# Patient Record
Sex: Male | Born: 1955 | Race: Asian | Hispanic: No | Marital: Married | Smoking: Current every day smoker
Health system: Southern US, Community
[De-identification: ages and names within clinical notes are randomized; demographics above are authoritative.]

## PROBLEM LIST (undated history)

## (undated) ENCOUNTER — Emergency Department (HOSPITAL_BASED_OUTPATIENT_CLINIC_OR_DEPARTMENT_OTHER): Payer: BC Managed Care – PPO

## (undated) DIAGNOSIS — E785 Hyperlipidemia, unspecified: Secondary | ICD-10-CM

## (undated) DIAGNOSIS — R7303 Prediabetes: Secondary | ICD-10-CM

## (undated) HISTORY — DX: Prediabetes: R73.03

## (undated) HISTORY — DX: Hyperlipidemia, unspecified: E78.5

---

## 2016-05-27 ENCOUNTER — Other Ambulatory Visit: Payer: Self-pay | Admitting: Family Medicine

## 2016-05-27 DIAGNOSIS — R1013 Epigastric pain: Secondary | ICD-10-CM

## 2016-06-04 ENCOUNTER — Ambulatory Visit
Admission: RE | Admit: 2016-06-04 | Discharge: 2016-06-04 | Disposition: A | Discharge status: Home / Self Care | Payer: Commercial Managed Care - HMO | Source: Ambulatory Visit | Attending: Family Medicine | Admitting: Family Medicine

## 2016-06-04 DIAGNOSIS — R1013 Epigastric pain: Secondary | ICD-10-CM

## 2016-06-14 ENCOUNTER — Other Ambulatory Visit: Payer: Self-pay | Admitting: Physician Assistant

## 2016-06-14 DIAGNOSIS — K769 Liver disease, unspecified: Secondary | ICD-10-CM

## 2016-06-14 DIAGNOSIS — R748 Abnormal levels of other serum enzymes: Secondary | ICD-10-CM

## 2016-06-14 DIAGNOSIS — K625 Hemorrhage of anus and rectum: Secondary | ICD-10-CM

## 2016-06-28 ENCOUNTER — Ambulatory Visit
Admission: RE | Admit: 2016-06-28 | Discharge: 2016-06-28 | Disposition: A | Discharge status: Home / Self Care | Payer: Commercial Managed Care - HMO | Source: Ambulatory Visit | Attending: Physician Assistant | Admitting: Physician Assistant

## 2016-06-28 DIAGNOSIS — K625 Hemorrhage of anus and rectum: Secondary | ICD-10-CM

## 2016-06-28 DIAGNOSIS — K769 Liver disease, unspecified: Secondary | ICD-10-CM

## 2016-06-28 DIAGNOSIS — R748 Abnormal levels of other serum enzymes: Secondary | ICD-10-CM

## 2016-06-28 MED ORDER — IOPAMIDOL (ISOVUE-300) INJECTION 61%
100.0000 mL | Freq: Once | INTRAVENOUS | Status: AC | PRN
  Administered 2016-06-28: 100 mL via INTRAVENOUS

## 2018-02-24 ENCOUNTER — Ambulatory Visit (INDEPENDENT_AMBULATORY_CARE_PROVIDER_SITE_OTHER): Payer: BLUE CROSS/BLUE SHIELD | Admitting: Family Medicine

## 2018-02-24 ENCOUNTER — Encounter: Payer: Self-pay | Admitting: Family Medicine

## 2018-02-24 VITALS — BP 110/78 | HR 80 | Temp 97.8°F | Ht 68.0 in | Wt 159.4 lb

## 2018-02-24 DIAGNOSIS — N529 Male erectile dysfunction, unspecified: Secondary | ICD-10-CM | POA: Diagnosis not present

## 2018-02-24 DIAGNOSIS — Z720 Tobacco use: Secondary | ICD-10-CM | POA: Diagnosis not present

## 2018-02-24 DIAGNOSIS — Z0000 Encounter for general adult medical examination without abnormal findings: Secondary | ICD-10-CM

## 2018-02-24 DIAGNOSIS — Z125 Encounter for screening for malignant neoplasm of prostate: Secondary | ICD-10-CM | POA: Diagnosis not present

## 2018-02-24 LAB — COMPREHENSIVE METABOLIC PANEL
ALT: 27 U/L (ref 0–53)
AST: 17 U/L (ref 0–37)
Albumin: 4.4 g/dL (ref 3.5–5.2)
Alkaline Phosphatase: 44 U/L (ref 39–117)
BILIRUBIN TOTAL: 0.7 mg/dL (ref 0.2–1.2)
BUN: 16 mg/dL (ref 6–23)
CALCIUM: 9.6 mg/dL (ref 8.4–10.5)
CO2: 30 mEq/L (ref 19–32)
CREATININE: 0.78 mg/dL (ref 0.40–1.50)
Chloride: 103 mEq/L (ref 96–112)
GFR: 101.21 mL/min (ref >60.00)
Glucose, Bld: 103 mg/dL — ABNORMAL HIGH (ref 70–99)
Potassium: 4.1 mEq/L (ref 3.5–5.1)
Sodium: 140 mEq/L (ref 135–145)
Total Protein: 6.9 g/dL (ref 6.0–8.3)

## 2018-02-24 LAB — LIPID PANEL
Cholesterol: 202 mg/dL — ABNORMAL HIGH (ref 0–200)
HDL: 43.2 mg/dL (ref >39.00)
LDL CALC: 132 mg/dL — AB (ref 0–99)
NonHDL: 158.57
TRIGLYCERIDES: 134 mg/dL (ref 0.0–149.0)
Total CHOL/HDL Ratio: 5
VLDL: 26.8 mg/dL (ref 0.0–40.0)

## 2018-02-24 LAB — PSA: PSA: 0.99 ng/mL (ref 0.10–4.00)

## 2018-02-24 MED ORDER — SILDENAFIL CITRATE 100 MG PO TABS: 50.0000 mg | ORAL_TABLET | Freq: Every day | ORAL | 0 refills | Status: DC | PRN

## 2018-02-24 NOTE — Progress Notes (Signed)
Chief Complaint  Patient presents with  . New Patient (Initial Visit)    Well Male Justin Edwards is here for a complete physical.   His last physical was >1 year ago.  Current diet: in general, a "healthy" diet.  Current exercise: running Weight trend: stable Does pt snore? Yes. No apneic episodes or gasping for air. Daytime fatigue? No. Seat belt? Yes.    Health maintenance Shingrix- No Colonoscopy- Yes Tetanus- unsure HIV- Yes 2017 Hep C- Yes 2017   Past Medical History:  Diagnosis Date  . Hyperlipidemia      History reviewed. No pertinent surgical history.  Medications  Takes no meds routinely.  Allergies No Known Allergies  Family History Family History  Problem Relation Age of Onset  . Anxiety disorder Father     Review of Systems: Constitutional:  no fevers Eye:  no recent significant change in vision Ear/Nose/Mouth/Throat:  Ears:  no hearing loss Nose/Mouth/Throat:  no complaints of nasal congestion, no sore throat Cardiovascular:  no chest pain, no palpitations Respiratory:  no cough and no shortness of breath Gastrointestinal: +intermittent reflux; no abdominal pain, no change in bowel habits GU:  Male: negative for dysuria, frequency, and incontinence and negative for prostate symptoms; +ED Musculoskeletal/Extremities:  no pain, redness, or swelling of the joints Integumentary (Skin/Breast):  no abnormal skin lesions reported Neurologic:  no headaches Endocrine: No unexpected weight changes Hematologic/Lymphatic:  no abnormal bleeding  Exam BP 110/78 (BP Location: Left Arm, Patient Position: Sitting, Cuff Size: Normal)   Pulse 80   Temp 97.8 F (36.6 C) (Oral)   Ht 5\' 8"  (1.727 m)   Wt 159 lb 6 oz (72.3 kg)   SpO2 95%   BMI 24.23 kg/m  General:  well developed, well nourished, in no apparent distress Skin:  no significant moles, warts, or growths Head:  no masses, lesions, or tenderness Eyes:  pupils equal and round, sclera anicteric  without injection Ears:  canals without lesions, TMs shiny without retraction, no obvious effusion, no erythema Nose:  nares patent, septum midline, mucosa normal Throat/Pharynx:  lips and gingiva without lesion; tongue and uvula midline; non-inflamed pharynx; no exudates or postnasal drainage Neck: neck supple without adenopathy, thyromegaly, or masses Lungs:  clear to auscultation, breath sounds equal bilaterally, no respiratory distress Cardio:  regular rate and rhythm, no LE edema, no bruits Abdomen:  abdomen soft, nontender; bowel sounds normal; no masses or organomegaly Genital (male): Uncircumcised penis, no lesions or discharge; testes present bilaterally without masses or tenderness Rectal: Deferred Musculoskeletal:  symmetrical muscle groups noted without atrophy or deformity Extremities:  no clubbing, cyanosis, or edema, no deformities, no skin discoloration Neuro:  gait normal; deep tendon reflexes normal and symmetric Psych: well oriented with normal range of affect and appropriate judgment/insight  Assessment and Plan  Well adult exam - Plan: Lipid panel, Comprehensive metabolic panel  Screening for prostate cancer - Plan: PSA  Tobacco abuse  Erectile dysfunction, unspecified erectile dysfunction type   Well 63 y.o. male. Counseled on diet and exercise. Counseled on risks and benefits of prostate cancer screening with PSA. The patient agrees to undergo testing. Immunizations, labs, and further orders as above. Info on Shingrix given. Declines flu shot. Had a shot around 3 yrs ago but does not remember what it was. He will let us know as he will consider PCV23 and Tdap.  Stop smoking. Trial Viagra. Follow up in 1 yr pending above. The patient voiced understanding and agreement to the plan.  [employer]  Carren Rangaul [place], DO 02/24/18 7:37 AM

## 2018-02-24 NOTE — Patient Instructions (Addendum)
Give [place] 2-3 business days to get the results of your labs back.   Keep the diet clean and stay active. Add some weight resistance training to your workout regimen.   The new Shingrix vaccine (for shingles) is a 2 shot series. It can make people feel low energy, achy and almost like they have the flu for 48 hours after injection. Please plan accordingly when deciding on when to get this shot. Call our office for a nurse visit appointment to get this. The second shot of the series is less severe regarding the side effects, but it still lasts 48 hours.   Stop smoking.    Find out what shot you got 3 years ago and let us know.   Let us know if you need anything.

## 2018-02-24 NOTE — Progress Notes (Signed)
Pre visit review using our clinic review tool, if applicable. No additional management support is needed unless otherwise documented below in the visit note. 

## 2018-09-01 ENCOUNTER — Other Ambulatory Visit: Payer: Self-pay

## 2018-09-01 ENCOUNTER — Encounter: Payer: Self-pay | Admitting: Family Medicine

## 2018-09-01 ENCOUNTER — Ambulatory Visit (INDEPENDENT_AMBULATORY_CARE_PROVIDER_SITE_OTHER): Payer: BC Managed Care – PPO | Admitting: Family Medicine

## 2018-09-01 VITALS — BP 108/68 | HR 95 | Temp 98.1°F | Ht 68.0 in | Wt 164.2 lb

## 2018-09-01 DIAGNOSIS — R350 Frequency of micturition: Secondary | ICD-10-CM | POA: Diagnosis not present

## 2018-09-01 NOTE — Patient Instructions (Signed)
Give [place] 2-3 business days to get the results of your labs back.   Mind caffeine intake.  Let us know if you need anything.

## 2018-09-01 NOTE — Progress Notes (Signed)
Chief Complaint  Patient presents with  . Follow-up    Subjective: Patient is a 63 y.o. male here for urinary freq.  3 mo, goes every 2 hours while awake, no nighttime s/s's. No change in diet or oral intake. Sometimes will get very hungry and start sweating if he doesn't eat. Curious to see if he has diabetes. Rarely drinks caffeine. No alcohol use. He is gassy. He is not having any pain, bleeding, constipation, fevers, rashes, weight changes.    ROS: Const: no fevers GU: As noted in HPI  Past Medical History:  Diagnosis Date  . Hyperlipidemia     Objective: BP 108/68 (BP Location: Left Arm, Patient Position: Sitting, Cuff Size: Normal)   Pulse 95   Temp 98.1 F (36.7 C) (Oral)   Ht 5\' 8"  (1.727 m)   Wt 164 lb 4 oz (74.5 kg)   SpO2 97%   BMI 24.97 kg/m  General: Awake, appears stated age HEENT: MMM Abd: BS+, S, mildly distended, NT, no masses Heart: RRR, no murmurs Lungs: CTAB, no rales, wheezes or rhonchi. No accessory muscle use Psych: Age appropriate judgment and insight, normal affect and mood  Assessment and Plan: Frequent urination - Plan: Hemoglobin A1c, Urinalysis, Routine w reflex microscopic, UA neg, if a1c WNL, will rec simethicone and bowel reg for 1 week, would start alpha 1 blocker if that is of no help. PSA in Jan of this year WNL.   The patient voiced understanding and agreement to the plan.  [place], DO 09/01/18  4:40 PM

## 2018-09-02 ENCOUNTER — Encounter: Payer: Self-pay | Admitting: Family Medicine

## 2018-09-02 DIAGNOSIS — R7303 Prediabetes: Secondary | ICD-10-CM | POA: Insufficient documentation

## 2018-09-02 LAB — URINALYSIS, ROUTINE W REFLEX MICROSCOPIC
Bilirubin Urine: NEGATIVE
Glucose, UA: NEGATIVE
Hgb urine dipstick: NEGATIVE
Ketones, ur: NEGATIVE
Leukocytes,Ua: NEGATIVE
Nitrite: NEGATIVE
Protein, ur: NEGATIVE
Specific Gravity, Urine: 1.023 (ref 1.001–1.03)
pH: <=5 (ref 5.0–8.0)

## 2018-09-02 LAB — HEMOGLOBIN A1C
Hgb A1c MFr Bld: 6.1 % of total Hgb — ABNORMAL HIGH (ref <5.7)
Mean Plasma Glucose: 128 (calc)
eAG (mmol/L): 7.1 (calc)

## 2018-09-05 ENCOUNTER — Telehealth: Payer: Self-pay | Admitting: Family Medicine

## 2018-09-05 NOTE — Telephone Encounter (Signed)
f °

## 2018-09-05 NOTE — Telephone Encounter (Signed)
Copied from [place] [id number]. Topic: General - Other >> Sep 05, 2018 11:18 AM Rayann Heman wrote: Reason for CRM: pt wife called and stated that pt did not understand everything when they called about labs. Pt wife would like a call so that she can understand. Please advise

## 2018-09-05 NOTE — Telephone Encounter (Signed)
Called back to the patient and went over lab results/instructions (09/01/2018 results) again. He did verbalize understanding and had no other questions at this time.

## 2018-12-15 ENCOUNTER — Encounter: Payer: Self-pay | Admitting: Family Medicine

## 2018-12-15 ENCOUNTER — Ambulatory Visit (INDEPENDENT_AMBULATORY_CARE_PROVIDER_SITE_OTHER): Payer: BC Managed Care – PPO | Admitting: Family Medicine

## 2018-12-15 ENCOUNTER — Other Ambulatory Visit: Payer: Self-pay

## 2018-12-15 DIAGNOSIS — R059 Cough, unspecified: Secondary | ICD-10-CM

## 2018-12-15 DIAGNOSIS — R05 Cough: Secondary | ICD-10-CM

## 2018-12-15 MED ORDER — PREDNISONE 20 MG PO TABS: 40.0000 mg | ORAL_TABLET | Freq: Every day | ORAL | 0 refills | Status: AC

## 2018-12-15 NOTE — Progress Notes (Signed)
Chief Complaint  Patient presents with  . Cough    2 weeks    Alvino Chapel here for URI complaints. Due to COVID-19 pandemic, we are interacting via web portal for an electronic face-to-face visit. I verified patient's ID using 2 identifiers. Patient agreed to proceed with visit via this method. Patient is at home, I am at office. Patient and I are present for visit.   Duration: 2 weeks  Associated symptoms: sinus congestion, rhinorrhea, itchy watery eyes, shortness of breath and cough Denies: sinus pain, ear pain, ear drainage, sore throat, wheezing, myalgia and fevers, n/v/d Treatment to date: none Sick contacts: No  +hx of smoking Requesting CXR.  ROS:  Const: Denies fevers HEENT: As noted in HPI Lungs: +cough  Past Medical History:  Diagnosis Date  . Hyperlipidemia   . Prediabetes    Exam No conversational dyspnea Age appropriate judgment and insight Nml affect and mood  Cough - Plan: predniSONE (DELTASONE) 20 MG tablet  Pred burst. CXR at [facility] on COVID testing.  Continue to push fluids, practice good hand hygiene, cover mouth when coughing. F/u prn. If starting to experience fevers, shaking, or shortness of breath, seek immediate care. Pt voiced understanding and agreement to the plan.  [place], DO 12/15/18 3:14 PM

## 2018-12-29 ENCOUNTER — Ambulatory Visit (INDEPENDENT_AMBULATORY_CARE_PROVIDER_SITE_OTHER)
Admission: RE | Admit: 2018-12-29 | Discharge: 2018-12-29 | Disposition: A | Discharge status: Home / Self Care | Payer: BC Managed Care – PPO | Source: Ambulatory Visit | Attending: Family Medicine | Admitting: Family Medicine

## 2018-12-29 ENCOUNTER — Other Ambulatory Visit: Payer: Self-pay | Admitting: Family Medicine

## 2018-12-29 ENCOUNTER — Other Ambulatory Visit: Payer: Self-pay

## 2018-12-29 DIAGNOSIS — R05 Cough: Secondary | ICD-10-CM | POA: Diagnosis not present

## 2018-12-29 DIAGNOSIS — R059 Cough, unspecified: Secondary | ICD-10-CM

## 2019-08-31 ENCOUNTER — Ambulatory Visit (INDEPENDENT_AMBULATORY_CARE_PROVIDER_SITE_OTHER): Payer: BC Managed Care – PPO | Admitting: Family Medicine

## 2019-08-31 ENCOUNTER — Encounter: Payer: Self-pay | Admitting: Family Medicine

## 2019-08-31 ENCOUNTER — Other Ambulatory Visit: Payer: Self-pay

## 2019-08-31 VITALS — BP 122/80 | HR 75 | Temp 98.3°F | Ht 67.0 in | Wt 163.4 lb

## 2019-08-31 DIAGNOSIS — N529 Male erectile dysfunction, unspecified: Secondary | ICD-10-CM

## 2019-08-31 DIAGNOSIS — Z0000 Encounter for general adult medical examination without abnormal findings: Secondary | ICD-10-CM | POA: Diagnosis not present

## 2019-08-31 DIAGNOSIS — K2100 Gastro-esophageal reflux disease with esophagitis, without bleeding: Secondary | ICD-10-CM | POA: Diagnosis not present

## 2019-08-31 DIAGNOSIS — Z125 Encounter for screening for malignant neoplasm of prostate: Secondary | ICD-10-CM

## 2019-08-31 MED ORDER — SILDENAFIL CITRATE 100 MG PO TABS: 50.0000 mg | ORAL_TABLET | Freq: Every day | ORAL | 0 refills | Status: DC | PRN

## 2019-08-31 NOTE — Progress Notes (Signed)
Chief Complaint  Patient presents with  . Annual Exam    Well Male Justin Edwards is here for a complete physical.   His last physical was >1 year ago.  Current diet: in general, a pretty healthy diet.  Current exercise: cardio, wt resistance exercise Weight trend: stable Fatigue out of ordinary? No. Seat belt? Yes.    Health maintenance Shingrix- No Colonoscopy- Yes Tetanus- No HIV- Yes Hep C- Yes  Past Medical History:  Diagnosis Date  . Hyperlipidemia   . Prediabetes      History reviewed. No pertinent surgical history.  Medications  Current Outpatient Medications on File Prior to Visit  Medication Sig Dispense Refill  . sildenafil (VIAGRA) 100 MG tablet Take 0.5-1 tablets (50-100 mg total) by mouth daily as needed for erectile dysfunction. 10 tablet 0   Allergies No Known Allergies  Family History Family History  Problem Relation Age of Onset  . Anxiety disorder Father     Review of Systems: Constitutional:  no fevers Eye:  no recent significant change in vision Ear/Nose/Mouth/Throat:  Ears:  no hearing loss Nose/Mouth/Throat:  no complaints of nasal congestion, no sore throat Cardiovascular:  no chest pain Respiratory:  no shortness of breath Gastrointestinal:  no change in bowel habits GU:  Male: negative for dysuria, frequency Musculoskeletal/Extremities:  no joint pain Integumentary (Skin/Breast):  no abnormal skin lesions reported Neurologic:  no headaches Endocrine: No unexpected weight changes Hematologic/Lymphatic:  no abnormal bleeding  Exam BP 122/80 (BP Location: Left Arm, Patient Position: Sitting, Cuff Size: Normal)   Pulse 75   Temp 98.3 F (36.8 C) (Oral)   Ht 5\' 7"  (1.702 m)   Wt 163 lb 6 oz (74.1 kg)   SpO2 98%   BMI 25.59 kg/m  General:  well developed, well nourished, in no apparent distress Skin:  no significant moles, warts, or growths Head:  no masses, lesions, or tenderness Eyes:  pupils equal and round, sclera  anicteric without injection Ears:  canals without lesions, TMs shiny without retraction, no obvious effusion, no erythema Nose:  nares patent, septum midline, mucosa normal Throat/Pharynx:  lips and gingiva without lesion; tongue and uvula midline; non-inflamed pharynx; no exudates or postnasal drainage Neck: neck supple without adenopathy, thyromegaly, or masses Cardiac: RRR, no bruits, no LE edema Lungs:  clear to auscultation, breath sounds equal bilaterally, no respiratory distress Rectal: Deferred Musculoskeletal:  symmetrical muscle groups noted without atrophy or deformity Neuro:  gait normal; deep tendon reflexes normal and symmetric Psych: well oriented with normal range of affect and appropriate judgment/insight  Assessment and Plan  Well adult exam - Plan: CBC, Comprehensive metabolic panel, Lipid panel  Screening for prostate cancer - Plan: PSA  Gastroesophageal reflux disease with esophagitis, unspecified whether hemorrhage - Plan: Ambulatory referral to Gastroenterology   Well 64 y.o. male. Counseled on diet and exercise. Counseled on risks and benefits of prostate cancer screening with PSA. The patient agrees to undergo testing. 1-2 mo of reflux helped by Tums. Wife requesting he have an EGD. Will refer to GI for that conversation. Pt is open to whatever is appropriate.  Immunizations, labs, and further orders as above. Follow up in 1 yr. The patient voiced understanding and agreement to the plan.  [address] [place], DO 08/31/19 2:52 PM

## 2019-08-31 NOTE — Patient Instructions (Addendum)
Give [place] 2-3 business days to get the results of your labs back.   Keep the diet clean and stay active.  If you do not hear anything about your referral in the next 1-2 weeks, call our office and ask for an update.  I recommend getting the flu shot in mid October. This suggestion would change if the CDC comes out with a different recommendation.   I think the most important shot left for you to get is the pneumonia vaccine. After that the Tdap (tetanus) booster and lastly the shingles vaccine.   The new Shingrix vaccine (for shingles) is a 2 shot series. It can make people feel low energy, achy and almost like they have the flu for 48 hours after injection. Please plan accordingly when deciding on when to get this shot. Call our office for a nurse visit appointment to get this. The second shot of the series is less severe regarding the side effects, but it still lasts 48 hours.   Call your insurance company to see what erectile dysfunction medications they wil cover. Looking for manufacturer coupons online is also reasonable. Look into GoodRx also.   Let us know if you need anything.

## 2019-08-31 NOTE — Addendum Note (Signed)
Addended by: Harley Alto on: 08/31/2019 03:06 PM   Modules accepted: Orders

## 2019-09-01 LAB — LIPID PANEL
Cholesterol: 217 mg/dL — ABNORMAL HIGH (ref <200)
HDL: 35 mg/dL — ABNORMAL LOW (ref >=40)
LDL Cholesterol (Calc): 137 mg/dL (calc) — ABNORMAL HIGH
Non-HDL Cholesterol (Calc): 182 mg/dL (calc) — ABNORMAL HIGH (ref <130)
Total CHOL/HDL Ratio: 6.2 (calc) — ABNORMAL HIGH (ref <5.0)
Triglycerides: 312 mg/dL — ABNORMAL HIGH (ref <150)

## 2019-09-01 LAB — COMPREHENSIVE METABOLIC PANEL
AG Ratio: 1.8 (calc) (ref 1.0–2.5)
ALT: 19 U/L (ref 9–46)
AST: 16 U/L (ref 10–35)
Albumin: 4.2 g/dL (ref 3.6–5.1)
Alkaline phosphatase (APISO): 41 U/L (ref 35–144)
BUN: 18 mg/dL (ref 7–25)
CO2: 24 mmol/L (ref 20–32)
Calcium: 9.3 mg/dL (ref 8.6–10.3)
Chloride: 105 mmol/L (ref 98–110)
Creat: 0.96 mg/dL (ref 0.70–1.25)
Globulin: 2.4 g/dL (calc) (ref 1.9–3.7)
Glucose, Bld: 133 mg/dL — ABNORMAL HIGH (ref 65–99)
Potassium: 3.8 mmol/L (ref 3.5–5.3)
Sodium: 140 mmol/L (ref 135–146)
Total Bilirubin: 0.4 mg/dL (ref 0.2–1.2)
Total Protein: 6.6 g/dL (ref 6.1–8.1)

## 2019-09-01 LAB — CBC
HCT: 45 % (ref 38.5–50.0)
Hemoglobin: 14.8 g/dL (ref 13.2–17.1)
MCH: 31.4 pg (ref 27.0–33.0)
MCHC: 32.9 g/dL (ref 32.0–36.0)
MCV: 95.3 fL (ref 80.0–100.0)
MPV: 9.6 fL (ref 7.5–12.5)
Platelets: 344 10*3/uL (ref 140–400)
RBC: 4.72 10*6/uL (ref 4.20–5.80)
RDW: 12.3 % (ref 11.0–15.0)
WBC: 7.9 10*3/uL (ref 3.8–10.8)

## 2019-09-01 LAB — PSA: PSA: 0.7 ng/mL (ref <=4.0)

## 2019-09-03 ENCOUNTER — Other Ambulatory Visit: Payer: Self-pay | Admitting: Family Medicine

## 2019-09-03 DIAGNOSIS — R7303 Prediabetes: Secondary | ICD-10-CM

## 2019-10-19 ENCOUNTER — Other Ambulatory Visit: Payer: BC Managed Care – PPO

## 2019-10-25 NOTE — Addendum Note (Signed)
Addended by: Mervin Kung A on: 10/25/2019 08:50 AM   Modules accepted: Orders

## 2019-10-26 ENCOUNTER — Other Ambulatory Visit: Payer: Self-pay

## 2019-10-26 ENCOUNTER — Other Ambulatory Visit (INDEPENDENT_AMBULATORY_CARE_PROVIDER_SITE_OTHER): Payer: BC Managed Care – PPO

## 2019-10-26 DIAGNOSIS — R7303 Prediabetes: Secondary | ICD-10-CM

## 2019-10-27 LAB — HEMOGLOBIN A1C
Hgb A1c MFr Bld: 6.2 % of total Hgb — ABNORMAL HIGH (ref <5.7)
Mean Plasma Glucose: 131 (calc)
eAG (mmol/L): 7.3 (calc)

## 2019-10-27 LAB — LIPID PANEL
Cholesterol: 208 mg/dL — ABNORMAL HIGH (ref <200)
HDL: 42 mg/dL (ref >=40)
LDL Cholesterol (Calc): 138 mg/dL (calc) — ABNORMAL HIGH
Non-HDL Cholesterol (Calc): 166 mg/dL (calc) — ABNORMAL HIGH (ref <130)
Total CHOL/HDL Ratio: 5 (calc) — ABNORMAL HIGH (ref <5.0)
Triglycerides: 152 mg/dL — ABNORMAL HIGH (ref <150)

## 2019-10-29 ENCOUNTER — Other Ambulatory Visit: Payer: Self-pay | Admitting: Family Medicine

## 2019-10-29 DIAGNOSIS — R7303 Prediabetes: Secondary | ICD-10-CM

## 2019-10-29 MED ORDER — ROSUVASTATIN CALCIUM 10 MG PO TABS: 10.0000 mg | ORAL_TABLET | Freq: Every day | ORAL | 3 refills | Status: DC

## 2019-11-01 ENCOUNTER — Telehealth: Payer: Self-pay | Admitting: Family Medicine

## 2019-11-01 NOTE — Telephone Encounter (Signed)
Patient states medication is very expensive. Patient states he would like to try to bring his numbers down by healthy eating and exercising.   Please advise

## 2019-11-02 MED ORDER — PRAVASTATIN SODIUM 40 MG PO TABS: 40.0000 mg | ORAL_TABLET | Freq: Every day | ORAL | 3 refills | Status: DC

## 2019-11-02 NOTE — Telephone Encounter (Signed)
Called left message to call back 

## 2019-11-02 NOTE — Telephone Encounter (Signed)
That's fine, but I will send in another one that should be cheaper if he wants to try once more. Ty.

## 2019-11-02 NOTE — Telephone Encounter (Signed)
Called informed of PCP response. He prefers to try to eat better/increase exercise at this time.

## 2019-11-06 ENCOUNTER — Telehealth: Payer: Self-pay | Admitting: Family Medicine

## 2019-11-06 NOTE — Telephone Encounter (Signed)
Caller: Justin Edwards Call Back # [phone]  Pt called in regards to medication questions he has for the scripts called into [facility] and the dosage instructions.  He wants to know if the medication could 'be eliminated by diet and exercise'. Please advise, pt is requesting a return call at earliest convenience    Thanks

## 2019-11-06 NOTE — Telephone Encounter (Signed)
Patient informed of PCP response. He will try diet first.

## 2019-11-06 NOTE — Telephone Encounter (Signed)
Possible though unlikely for him as he isn't a largely unhealthy individual. I had already sent med before seeing we wanted to try lifestyle mods. Ty.

## 2019-11-22 ENCOUNTER — Encounter: Payer: Self-pay | Admitting: Family Medicine

## 2019-11-30 ENCOUNTER — Encounter: Payer: Self-pay | Admitting: Family Medicine

## 2019-11-30 ENCOUNTER — Telehealth (INDEPENDENT_AMBULATORY_CARE_PROVIDER_SITE_OTHER): Payer: BC Managed Care – PPO | Admitting: Family Medicine

## 2019-11-30 ENCOUNTER — Other Ambulatory Visit: Payer: Self-pay

## 2019-11-30 DIAGNOSIS — R059 Cough, unspecified: Secondary | ICD-10-CM | POA: Diagnosis not present

## 2019-11-30 MED ORDER — ALBUTEROL SULFATE HFA 108 (90 BASE) MCG/ACT IN AERS: 2.0000 | INHALATION_SPRAY | Freq: Four times a day (QID) | RESPIRATORY_TRACT | 0 refills | Status: DC | PRN

## 2019-11-30 NOTE — Progress Notes (Signed)
Chief Complaint  Patient presents with  . Cough    due to smoking    Justin Edwards here for a cough. Due to COVID-19 pandemic, we are interacting via web portal for an electronic face-to-face visit. I verified patient's ID using 2 identifiers. Patient agreed to proceed with visit via this method. Patient is at home, I am at office. Patient and I are present for visit.   Duration: 10 days  Associated symptoms: some DOE Denies: sinus congestion, sinus pain, rhinorrhea, itchy watery eyes, ear pain, ear drainage, sore throat, wheezing, chest pain, myalgia and fevers, GERD Treatment to date: none Sick contacts: No  He just cut down on smoking after cough arose.  No weight loss or bleeding.   Past Medical History:  Diagnosis Date  . Hyperlipidemia   . Prediabetes    Exam No conversational dyspnea Age appropriate judgment and insight Nml affect and mood  Cough - Plan: DG Chest 2 View, albuterol (VENTOLIN HFA) 108 (90 Base) MCG/ACT inhaler  Does not sound infectious. 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 Trial SABA. Ck Xr. Follow up pending results or prn. If starting to experience fevers, shaking, or shortness of breath, seek immediate care. Pt voiced understanding and agreement to the plan.  Jilda Roche [place], DO 11/30/19 11:51 AM

## 2019-12-07 ENCOUNTER — Other Ambulatory Visit: Payer: Self-pay

## 2019-12-07 ENCOUNTER — Ambulatory Visit (INDEPENDENT_AMBULATORY_CARE_PROVIDER_SITE_OTHER)
Admission: RE | Admit: 2019-12-07 | Discharge: 2019-12-07 | Disposition: A | Discharge status: Home / Self Care | Payer: BC Managed Care – PPO | Source: Ambulatory Visit | Attending: Family Medicine | Admitting: Family Medicine

## 2019-12-07 DIAGNOSIS — R059 Cough, unspecified: Secondary | ICD-10-CM | POA: Diagnosis not present

## 2019-12-14 ENCOUNTER — Other Ambulatory Visit: Payer: BC Managed Care – PPO

## 2019-12-24 ENCOUNTER — Other Ambulatory Visit: Payer: Self-pay | Admitting: Family Medicine

## 2019-12-24 DIAGNOSIS — R059 Cough, unspecified: Secondary | ICD-10-CM

## 2019-12-28 ENCOUNTER — Other Ambulatory Visit: Payer: Self-pay

## 2019-12-28 ENCOUNTER — Other Ambulatory Visit: Payer: BC Managed Care – PPO

## 2020-01-04 ENCOUNTER — Other Ambulatory Visit: Payer: Self-pay | Admitting: Family Medicine

## 2020-01-04 ENCOUNTER — Other Ambulatory Visit (INDEPENDENT_AMBULATORY_CARE_PROVIDER_SITE_OTHER): Payer: BC Managed Care – PPO

## 2020-01-04 ENCOUNTER — Other Ambulatory Visit: Payer: Self-pay

## 2020-01-04 ENCOUNTER — Telehealth: Payer: Self-pay | Admitting: Family Medicine

## 2020-01-04 DIAGNOSIS — E785 Hyperlipidemia, unspecified: Secondary | ICD-10-CM

## 2020-01-04 DIAGNOSIS — R7303 Prediabetes: Secondary | ICD-10-CM

## 2020-01-04 LAB — LIPID PANEL
Cholesterol: 231 mg/dL — ABNORMAL HIGH (ref 0–200)
HDL: 45.9 mg/dL (ref >39.00)
LDL Cholesterol: 157 mg/dL — ABNORMAL HIGH (ref 0–99)
NonHDL: 185.19
Total CHOL/HDL Ratio: 5
Triglycerides: 139 mg/dL (ref 0.0–149.0)
VLDL: 27.8 mg/dL (ref 0.0–40.0)

## 2020-01-04 LAB — HEPATIC FUNCTION PANEL
ALT: 25 U/L (ref 0–53)
AST: 22 U/L (ref 0–37)
Albumin: 4.4 g/dL (ref 3.5–5.2)
Alkaline Phosphatase: 45 U/L (ref 39–117)
Bilirubin, Direct: 0.1 mg/dL (ref 0.0–0.3)
Total Bilirubin: 0.9 mg/dL (ref 0.2–1.2)
Total Protein: 7.1 g/dL (ref 6.0–8.3)

## 2020-01-04 NOTE — Telephone Encounter (Signed)
Pt came in office stating is wanting to change his pharmacy instead of [facility], pt would like to have his pharmacy to be [facility]. Please update for pt on chart.

## 2020-01-04 NOTE — Telephone Encounter (Signed)
Pharmacy updated.

## 2020-01-29 DIAGNOSIS — U071 COVID-19: Secondary | ICD-10-CM | POA: Diagnosis not present

## 2020-01-29 DIAGNOSIS — R059 Cough, unspecified: Secondary | ICD-10-CM | POA: Diagnosis not present

## 2020-02-15 ENCOUNTER — Other Ambulatory Visit: Payer: BC Managed Care – PPO

## 2020-02-22 ENCOUNTER — Other Ambulatory Visit (INDEPENDENT_AMBULATORY_CARE_PROVIDER_SITE_OTHER): Payer: BC Managed Care – PPO

## 2020-02-22 ENCOUNTER — Other Ambulatory Visit: Payer: Self-pay

## 2020-02-22 DIAGNOSIS — E785 Hyperlipidemia, unspecified: Secondary | ICD-10-CM

## 2020-02-22 LAB — LIPID PANEL
Cholesterol: 197 mg/dL (ref 0–200)
HDL: 48.8 mg/dL (ref >39.00)
LDL Cholesterol: 116 mg/dL — ABNORMAL HIGH (ref 0–99)
NonHDL: 147.87
Total CHOL/HDL Ratio: 4
Triglycerides: 161 mg/dL — ABNORMAL HIGH (ref 0.0–149.0)
VLDL: 32.2 mg/dL (ref 0.0–40.0)

## 2020-02-26 ENCOUNTER — Telehealth: Payer: Self-pay | Admitting: Family Medicine

## 2020-02-26 NOTE — Telephone Encounter (Signed)
Patient is requesting a call back in reference to lab results.   

## 2020-02-26 NOTE — Telephone Encounter (Signed)
Called informed of lab results.  . Cholesterol looks better. I think we can monitor the level for now with no changes to medicine.

## 2020-07-21 IMAGING — DX DG CHEST 2V
2 series · 2 of 2 positions shown · non-contrast
Comparison: None.

CLINICAL DATA: 63-year-old male with cough.

EXAM:
CHEST - 2 VIEW

[chest pa]
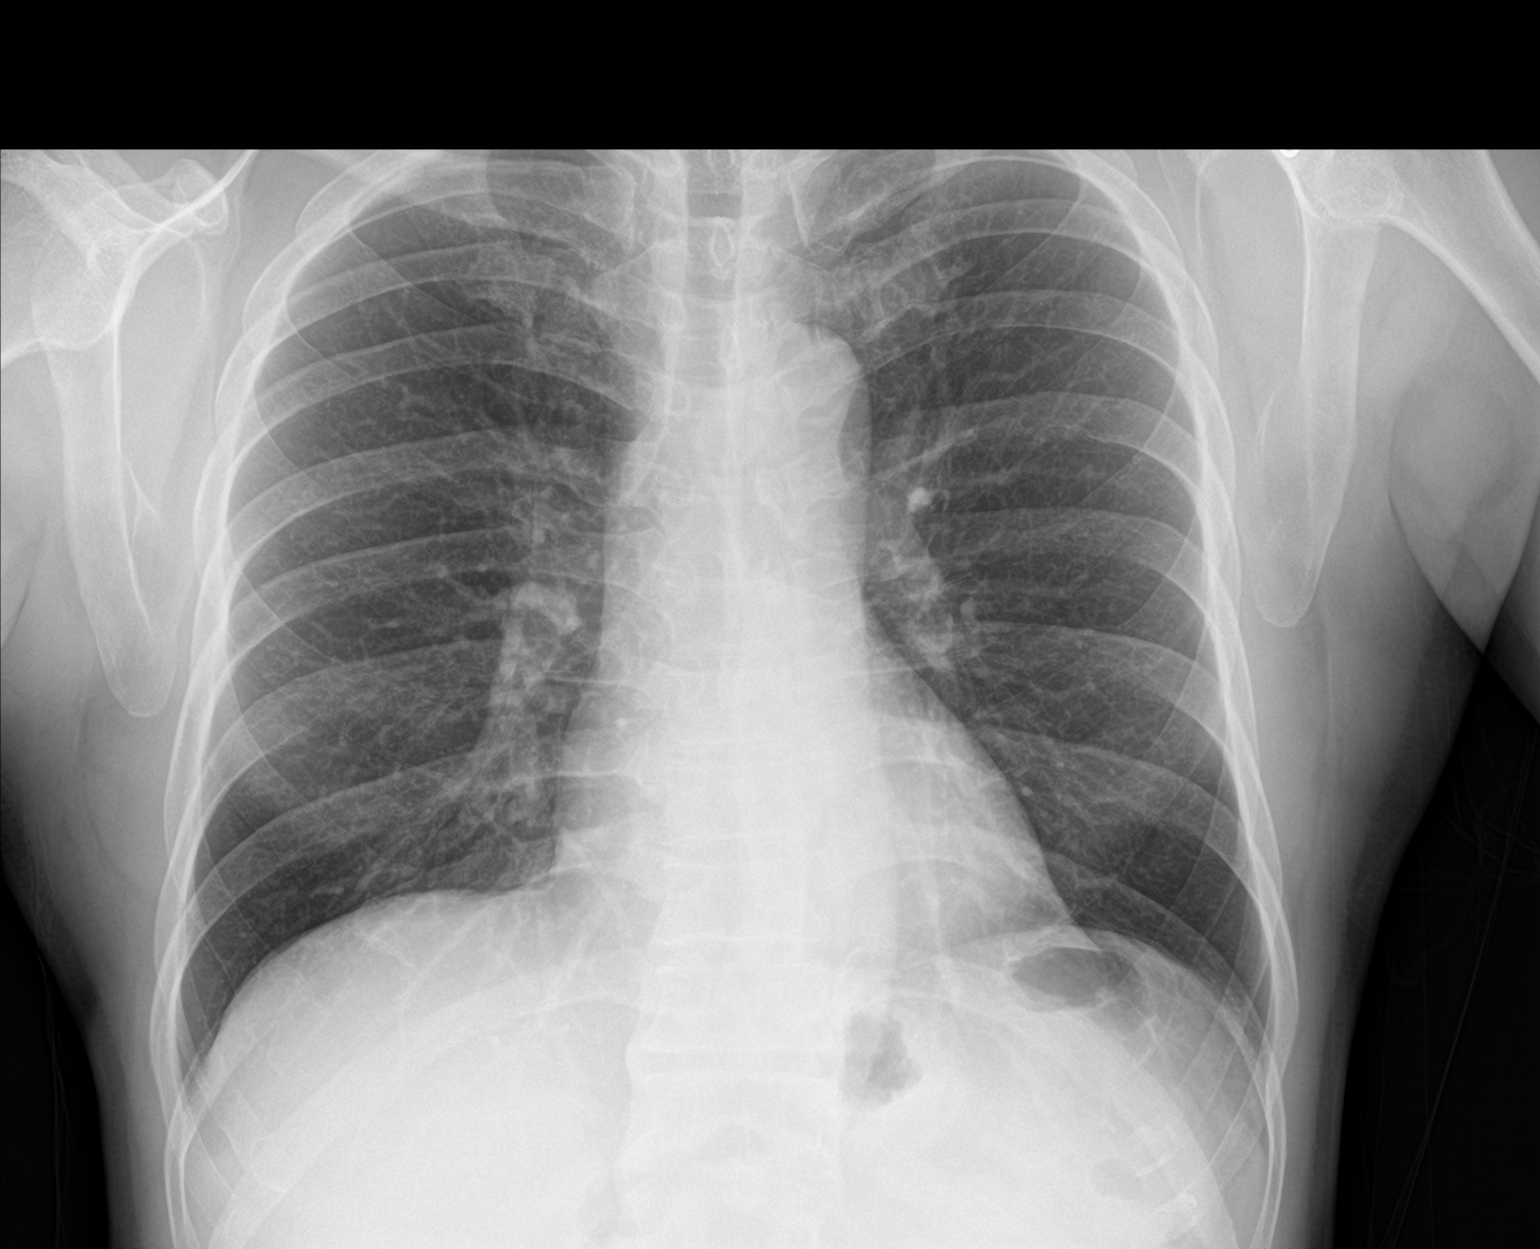

[chest lat]
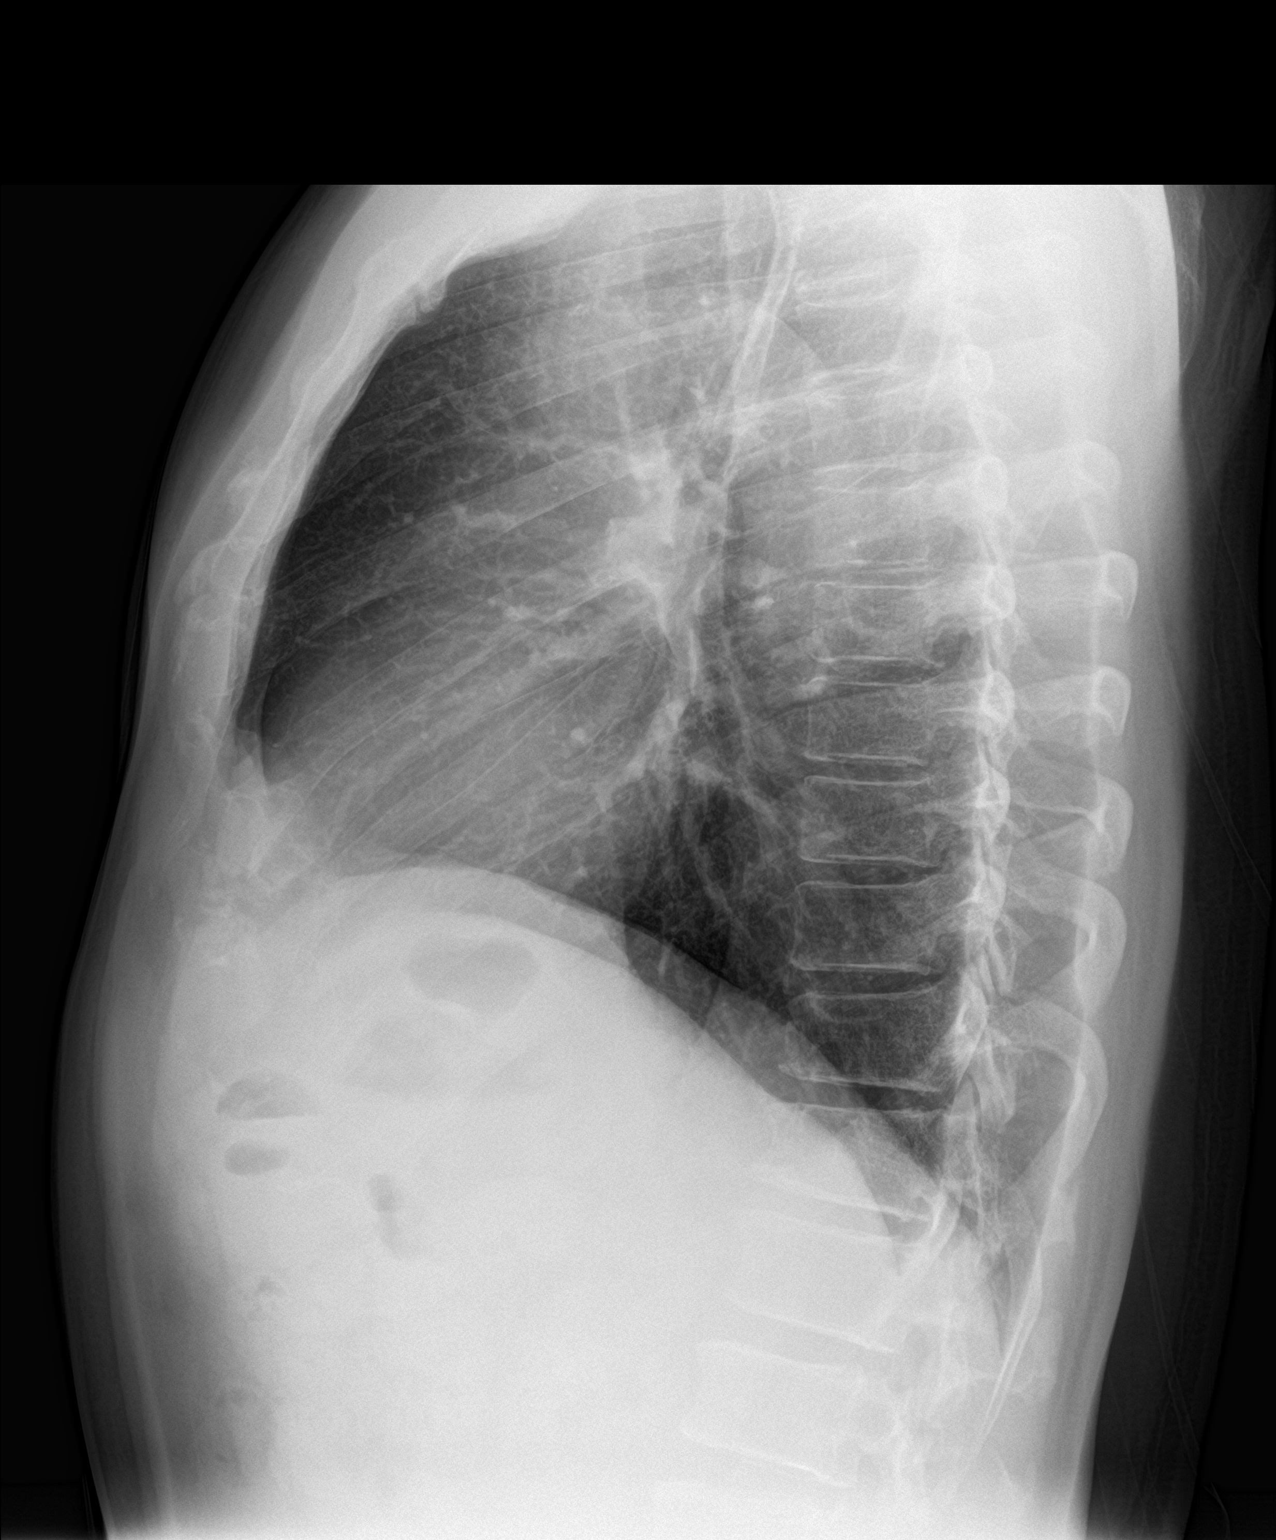

[2 of 2 positions shown; findings below may reference images not displayed]

FINDINGS: The heart size and mediastinal contours are within normal limits.
Both lungs are clear. The visualized skeletal structures are
unremarkable.
IMPRESSION: No active cardiopulmonary disease.

## 2021-04-03 DIAGNOSIS — K449 Diaphragmatic hernia without obstruction or gangrene: Secondary | ICD-10-CM | POA: Diagnosis not present

## 2021-04-03 DIAGNOSIS — D123 Benign neoplasm of transverse colon: Secondary | ICD-10-CM | POA: Diagnosis not present

## 2021-04-03 DIAGNOSIS — Z86010 Personal history of colonic polyps: Secondary | ICD-10-CM | POA: Diagnosis not present

## 2021-04-03 DIAGNOSIS — K2980 Duodenitis without bleeding: Secondary | ICD-10-CM | POA: Diagnosis not present

## 2021-04-03 DIAGNOSIS — B9681 Helicobacter pylori [H. pylori] as the cause of diseases classified elsewhere: Secondary | ICD-10-CM | POA: Diagnosis not present

## 2021-04-03 DIAGNOSIS — R1013 Epigastric pain: Secondary | ICD-10-CM | POA: Diagnosis not present

## 2021-04-03 DIAGNOSIS — K2100 Gastro-esophageal reflux disease with esophagitis, without bleeding: Secondary | ICD-10-CM | POA: Diagnosis not present

## 2021-04-03 DIAGNOSIS — K2930 Chronic superficial gastritis without bleeding: Secondary | ICD-10-CM | POA: Diagnosis not present

## 2021-04-17 ENCOUNTER — Other Ambulatory Visit (HOSPITAL_BASED_OUTPATIENT_CLINIC_OR_DEPARTMENT_OTHER): Payer: Self-pay

## 2021-04-17 MED ORDER — PYLERA 140-125-125 MG PO CAPS
3.0000 | ORAL_CAPSULE | Freq: Three times a day (TID) | ORAL | 0 refills | Status: DC
  Filled 2021-04-17: qty 120, 10d supply, fill #0

## 2021-04-20 ENCOUNTER — Other Ambulatory Visit (HOSPITAL_BASED_OUTPATIENT_CLINIC_OR_DEPARTMENT_OTHER): Payer: Self-pay

## 2021-04-21 ENCOUNTER — Other Ambulatory Visit (HOSPITAL_BASED_OUTPATIENT_CLINIC_OR_DEPARTMENT_OTHER): Payer: Self-pay

## 2021-04-22 ENCOUNTER — Other Ambulatory Visit (HOSPITAL_BASED_OUTPATIENT_CLINIC_OR_DEPARTMENT_OTHER): Payer: Self-pay

## 2021-04-23 ENCOUNTER — Other Ambulatory Visit (HOSPITAL_BASED_OUTPATIENT_CLINIC_OR_DEPARTMENT_OTHER): Payer: Self-pay

## 2021-05-11 ENCOUNTER — Encounter: Payer: BC Managed Care – PPO | Admitting: Family Medicine

## 2021-05-15 ENCOUNTER — Encounter: Payer: Self-pay | Admitting: Family Medicine

## 2021-05-15 ENCOUNTER — Ambulatory Visit (INDEPENDENT_AMBULATORY_CARE_PROVIDER_SITE_OTHER): Payer: BC Managed Care – PPO | Admitting: Family Medicine

## 2021-05-15 VITALS — BP 112/70 | HR 92 | Temp 98.0°F | Ht 67.0 in | Wt 162.4 lb

## 2021-05-15 DIAGNOSIS — Z125 Encounter for screening for malignant neoplasm of prostate: Secondary | ICD-10-CM

## 2021-05-15 DIAGNOSIS — N529 Male erectile dysfunction, unspecified: Secondary | ICD-10-CM

## 2021-05-15 DIAGNOSIS — Z0000 Encounter for general adult medical examination without abnormal findings: Secondary | ICD-10-CM | POA: Diagnosis not present

## 2021-05-15 MED ORDER — SILDENAFIL CITRATE 100 MG PO TABS: 50.0000 mg | ORAL_TABLET | Freq: Every day | ORAL | 0 refills | Status: DC | PRN

## 2021-05-15 NOTE — Patient Instructions (Addendum)
Give [place] 2-3 business days to get the results of your labs back.  ? ?Keep the diet clean and stay active. ? ?Please get me a copy of your advanced directive form at your convenience.  ? ?Use GoodRx for the sildenafil/Viagra. $0.49 at [facility]. ? ?Let us know if you need anything. ?

## 2021-05-15 NOTE — Progress Notes (Signed)
Chief Complaint  ?Patient presents with  ? Annual Exam  ? ? ?Well Male ?Justin Edwards is here for a complete physical.   ?His last physical was >1 year ago.  ?Current diet: in general, a "healthy" diet.  ?Current exercise: cycling, lifting wts ?Weight trend: stable ?Fatigue out of ordinary? No. ?Seat belt? Yes.   ?Advanced directive? No ? ?Health maintenance ?Shingrix- No ?Colonoscopy- Yes ?Tetanus- Yes ?HIV- Yes ?Hep C- Yes ?  ?Past Medical History:  ?Diagnosis Date  ? Hyperlipidemia   ? Prediabetes   ?  ? ?History reviewed. No pertinent surgical history. ? ?Medications  ?Current Outpatient Medications on File Prior to Visit  ?Medication Sig Dispense Refill  ? albuterol (VENTOLIN HFA) 108 (90 Base) MCG/ACT inhaler TAKE 2 PUFFS BY MOUTH EVERY 6 HOURS AS NEEDED FOR WHEEZE OR SHORTNESS OF BREATH 6.7 each 3  ? bismuth-metronidazole-tetracycline (PYLERA) 140-125-125 MG capsule Take 3 capsules by mouth 4 (four) times daily - after meals and at bedtime. 120 capsule 0  ? ?No current facility-administered medications on file prior to visit.  ?  ? ?Allergies ?No Known Allergies ? ?Family History ?Family History  ?Problem Relation Age of Onset  ? Anxiety disorder Father   ? ? ?Review of Systems: ?Constitutional:  no fevers ?Eye:  no recent significant change in vision ?Ear/Nose/Mouth/Throat:  Ears:  no hearing loss ?Nose/Mouth/Throat:  no complaints of nasal congestion, no sore throat ?Cardiovascular:  no chest pain ?Respiratory:  no shortness of breath ?Gastrointestinal:  no change in bowel habits ?GU:  Male: negative for dysuria, frequency ?Musculoskeletal/Extremities:  no joint pain ?Integumentary (Skin/Breast):  no abnormal skin lesions reported ?Neurologic:  no headaches ?Endocrine: No unexpected weight changes ?Hematologic/Lymphatic:  no abnormal bleeding ? ?Exam ?BP 112/70   Pulse 92   Temp 98 ?F (36.7 ?C) (Oral)   Ht 5\' 7"  (1.702 m)   Wt 162 lb 6 oz (73.7 kg)   SpO2 97%   BMI 25.43 kg/m?  ?General:  well  developed, well nourished, in no apparent distress ?Skin:  no significant moles, warts, or growths ?Head:  no masses, lesions, or tenderness ?Eyes:  pupils equal and round, sclera anicteric without injection ?Ears:  canals without lesions, TMs shiny without retraction, no obvious effusion, no erythema ?Nose:  nares patent, septum midline, mucosa normal ?Throat/Pharynx:  lips and gingiva without lesion; tongue and uvula midline; non-inflamed pharynx; no exudates or postnasal drainage ?Neck: neck supple without adenopathy, thyromegaly, or masses ?Cardiac: RRR, no bruits, no LE edema ?Lungs:  clear to auscultation, breath sounds equal bilaterally, no respiratory distress ?Abdomen: BS+, soft, non-tender, non-distended, no masses or organomegaly noted ?Rectal: Deferred ?Musculoskeletal:  symmetrical muscle groups noted without atrophy or deformity ?Neuro:  gait normal; deep tendon reflexes normal and symmetric ?Psych: well oriented with normal range of affect and appropriate judgment/insight ? ?Assessment and Plan ? ?Well adult exam - Plan: CBC, Comprehensive metabolic panel, Lipid panel, Hemoglobin A1c ? ?Screening for prostate cancer - Plan: PSA ? ?Erectile dysfunction, unspecified erectile dysfunction type - Plan: sildenafil (VIAGRA) 100 MG tablet  ? ?Well 66 y.o. male. ?Counseled on diet and exercise. ?Counseled on risks and benefits of prostate cancer screening with PSA. The patient agrees to undergo testing. ?Advanced directive form provided today.  ?GoodRx for sildenafil.  ?Immunizations, labs, and further orders as above. ?Follow up in 6 mo. ?The patient voiced understanding and agreement to the plan. ? ?Shelda Pal, DO ?05/15/21 ?2:43 PM ? ?

## 2021-05-22 ENCOUNTER — Other Ambulatory Visit (INDEPENDENT_AMBULATORY_CARE_PROVIDER_SITE_OTHER): Payer: BC Managed Care – PPO

## 2021-05-22 DIAGNOSIS — Z0000 Encounter for general adult medical examination without abnormal findings: Secondary | ICD-10-CM | POA: Diagnosis not present

## 2021-05-22 DIAGNOSIS — Z125 Encounter for screening for malignant neoplasm of prostate: Secondary | ICD-10-CM

## 2021-05-22 LAB — COMPREHENSIVE METABOLIC PANEL
ALT: 55 U/L — ABNORMAL HIGH (ref 0–53)
AST: 33 U/L (ref 0–37)
Albumin: 4.5 g/dL (ref 3.5–5.2)
Alkaline Phosphatase: 44 U/L (ref 39–117)
BUN: 15 mg/dL (ref 6–23)
CO2: 31 mEq/L (ref 19–32)
Calcium: 9.1 mg/dL (ref 8.4–10.5)
Chloride: 102 mEq/L (ref 96–112)
Creatinine, Ser: 0.83 mg/dL (ref 0.40–1.50)
GFR: 92.7 mL/min (ref >60.00)
Glucose, Bld: 89 mg/dL (ref 70–99)
Potassium: 4.3 mEq/L (ref 3.5–5.1)
Sodium: 140 mEq/L (ref 135–145)
Total Bilirubin: 0.8 mg/dL (ref 0.2–1.2)
Total Protein: 7.1 g/dL (ref 6.0–8.3)

## 2021-05-22 LAB — LIPID PANEL
Cholesterol: 256 mg/dL — ABNORMAL HIGH (ref 0–200)
HDL: 50.1 mg/dL (ref >39.00)
LDL Cholesterol: 178 mg/dL — ABNORMAL HIGH (ref 0–99)
NonHDL: 206.17
Total CHOL/HDL Ratio: 5
Triglycerides: 142 mg/dL (ref 0.0–149.0)
VLDL: 28.4 mg/dL (ref 0.0–40.0)

## 2021-05-22 LAB — HEMOGLOBIN A1C: Hgb A1c MFr Bld: 6.3 % (ref 4.6–6.5)

## 2021-05-22 LAB — CBC
HCT: 48.5 % (ref 39.0–52.0)
Hemoglobin: 16.3 g/dL (ref 13.0–17.0)
MCHC: 33.6 g/dL (ref 30.0–36.0)
MCV: 96 fl (ref 78.0–100.0)
Platelets: 349 10*3/uL (ref 150.0–400.0)
RBC: 5.05 Mil/uL (ref 4.22–5.81)
RDW: 13.1 % (ref 11.5–15.5)
WBC: 6.5 10*3/uL (ref 4.0–10.5)

## 2021-05-22 LAB — PSA: PSA: 0.78 ng/mL (ref 0.10–4.00)

## 2021-05-29 ENCOUNTER — Ambulatory Visit (INDEPENDENT_AMBULATORY_CARE_PROVIDER_SITE_OTHER): Payer: BC Managed Care – PPO | Admitting: Family Medicine

## 2021-05-29 ENCOUNTER — Encounter: Payer: Self-pay | Admitting: Family Medicine

## 2021-05-29 VITALS — BP 128/78 | HR 92 | Temp 98.2°F | Ht 67.0 in | Wt 162.5 lb

## 2021-05-29 DIAGNOSIS — R7401 Elevation of levels of liver transaminase levels: Secondary | ICD-10-CM

## 2021-05-29 DIAGNOSIS — Z87891 Personal history of nicotine dependence: Secondary | ICD-10-CM | POA: Diagnosis not present

## 2021-05-29 DIAGNOSIS — E785 Hyperlipidemia, unspecified: Secondary | ICD-10-CM | POA: Diagnosis not present

## 2021-05-29 NOTE — Patient Instructions (Addendum)
If you do not hear anything about your referral in the next 1-2 weeks, call our office and ask for an update. ? ?Fast for at least 6 hrs prior to your lab visit. Nothing fried or greasy within 24 hours of testing.  ? ?Let us know if you need anything. ?

## 2021-05-29 NOTE — Progress Notes (Signed)
Chief Complaint  ?Patient presents with  ? Follow-up  ? ? ?Subjective: ?Patient is a 66 y.o. male here for f/u. ? ?Just quit smoking 4 d ago. Feels good about his progress so far. Coughs at night every once in a while. No wheezing or sig sob. Reports smoking around 1 ppd for nearly 40 years which is different from previous hx. He has never had a CT chest performed for screening.  ? ?Past Medical History:  ?Diagnosis Date  ? Hyperlipidemia   ? Prediabetes   ? ? ?Objective: ?BP 128/78   Pulse 92   Temp 98.2 ?F (36.8 ?C) (Oral)   Ht 5\' 7"  (1.702 m)   Wt 162 lb 8 oz (73.7 kg)   SpO2 98%   BMI 25.45 kg/m?  ?General: Awake, appears stated age ?Heart: RRR ?Lungs: CTAB, no rales, wheezes or rhonchi. No accessory muscle use ?Psych: Age appropriate judgment and insight, normal affect and mood ? ?Assessment and Plan: ?Stopped smoking with greater than 20 pack year history - Plan: Ambulatory Referral Lung Cancer Screening [facility] ? ?Hyperlipidemia, unspecified hyperlipidemia type - Plan: Lipid panel, Hepatic function panel ? ?Elevated ALT measurement - Plan: Hepatic function panel ? ?Offered meds for possible COPD which he politely declined as it is not affecting his quality of life and not causing him to come in to doctor's office. Will order a low dose CT scan of chest as he has a 20 pack year hx, not a 15 pack year as previous documentation indicates. Commended him for smoking cessation.  ?Discussed rechecking labs at convenience. If we see atherosclerosis on imaging, may be a better indicator to go back on statin.  ?Rec'd Tdap which he politely declined as he is on Triple therapy for H pylori. Will offer again at f/u. Suggested he consider getting at pharmacy as well.  ?F/u as originally scheduled.  ?The patient voiced understanding and agreement to the plan. ? ?I spent 22 min w the patient discussing the above plan in addition to reviewing his chart on the same day of the visit.  ? ? ,  DO ?05/29/21  ?7:31 AM ? ? ? ? ?

## 2021-06-02 ENCOUNTER — Other Ambulatory Visit: Payer: BC Managed Care – PPO

## 2021-06-04 ENCOUNTER — Other Ambulatory Visit (INDEPENDENT_AMBULATORY_CARE_PROVIDER_SITE_OTHER): Payer: BC Managed Care – PPO

## 2021-06-04 DIAGNOSIS — R7401 Elevation of levels of liver transaminase levels: Secondary | ICD-10-CM | POA: Diagnosis not present

## 2021-06-04 DIAGNOSIS — E785 Hyperlipidemia, unspecified: Secondary | ICD-10-CM

## 2021-06-04 LAB — LIPID PANEL
Cholesterol: 231 mg/dL — ABNORMAL HIGH (ref 0–200)
HDL: 47.4 mg/dL (ref >39.00)
NonHDL: 183.7
Total CHOL/HDL Ratio: 5
Triglycerides: 201 mg/dL — ABNORMAL HIGH (ref 0.0–149.0)
VLDL: 40.2 mg/dL — ABNORMAL HIGH (ref 0.0–40.0)

## 2021-06-04 LAB — HEPATIC FUNCTION PANEL
ALT: 33 U/L (ref 0–53)
AST: 24 U/L (ref 0–37)
Albumin: 4.5 g/dL (ref 3.5–5.2)
Alkaline Phosphatase: 46 U/L (ref 39–117)
Bilirubin, Direct: 0.1 mg/dL (ref 0.0–0.3)
Total Bilirubin: 0.7 mg/dL (ref 0.2–1.2)
Total Protein: 7.2 g/dL (ref 6.0–8.3)

## 2021-06-04 LAB — LDL CHOLESTEROL, DIRECT: Direct LDL: 162 mg/dL

## 2021-06-08 ENCOUNTER — Other Ambulatory Visit (HOSPITAL_BASED_OUTPATIENT_CLINIC_OR_DEPARTMENT_OTHER): Payer: Self-pay

## 2021-06-08 ENCOUNTER — Other Ambulatory Visit: Payer: Self-pay | Admitting: Family Medicine

## 2021-06-08 ENCOUNTER — Telehealth: Payer: Self-pay | Admitting: Family Medicine

## 2021-06-08 DIAGNOSIS — E785 Hyperlipidemia, unspecified: Secondary | ICD-10-CM

## 2021-06-08 DIAGNOSIS — R7401 Elevation of levels of liver transaminase levels: Secondary | ICD-10-CM

## 2021-06-08 MED ORDER — ROSUVASTATIN CALCIUM 10 MG PO TABS
10.0000 mg | ORAL_TABLET | Freq: Every day | ORAL | 3 refills | Status: DC
Start: 2021-06-08 — End: 2021-07-17
  Filled 2021-06-08: qty 30, 30d supply, fill #0

## 2021-06-08 NOTE — Telephone Encounter (Signed)
Patient called and agrees to start statin--- please send in. ?What labs to order in 6 weeks??? ?

## 2021-06-08 NOTE — Telephone Encounter (Signed)
Called and scheduled lab appointment. ?Please send in prescription to the [facility] ?

## 2021-06-15 ENCOUNTER — Telehealth: Payer: Self-pay | Admitting: Family Medicine

## 2021-06-15 NOTE — Telephone Encounter (Signed)
Nurse Assessment Nurse: Susy Manor, RN, Megan Date/Time (Eastern Time): 06/15/2021 10:43:18 AM Confirm and document reason for call. If symptomatic, describe symptoms. ---Caller states Dr. Jone Baseman gave him medicine for cholesterol (Rosuvastatin) and he is experiencing dry cough, difficulty breathing. Caller stated the medication 3 days ago. Does the patient have any new or worsening symptoms? ---Yes Will a triage be completed? ---Yes Related visit to physician within the last 2 weeks? ---Yes Does the PT have any chronic conditions? (i.e. diabetes, asthma, this includes High risk factors for pregnancy, etc.) ---Yes List chronic conditions. ---high cholesterol, Is this a behavioral health or substance abuse call? ---No Guidelines Guideline Title Affirmed Question Affirmed Notes Nurse Date/Time (Eastern Time) Breathing Difficulty [1] MODERATE difficulty breathing (e.g., speaks in phrases, SOB even at rest, pulse 100-120) AND [2] NEW-onset Susy Manor, RN, Jinny Blossom 06/15/2021 10:44:58 AM PLEASE NOTE: All timestamps contained within this report are represented as [place] Standard Time. CONFIDENTIALTY NOTICE: This fax transmission is intended only for the addressee. It contains information that is legally privileged, confidential or otherwise protected from use or disclosure. If you are not the intended recipient, you are strictly prohibited from reviewing, disclosing, copying using or disseminating any of this information or taking any action in reliance on or regarding this information. If you have received this fax in error, please notify us immediately by telephone so that we can arrange for its return to [place]. Phone: [phone], Toll-Free: [phone], Fax: [phone] Page: 2 of 2 Call Id: [id number] Guidelines Guideline Title Affirmed Question Affirmed Notes Nurse Date/Time Eilene Ghazi Time) or WORSE than normal Disp. Time Eilene Ghazi Time) Disposition Final User 06/15/2021 10:40:27 AM Send  to Urgent Sylvan Cheese 06/15/2021 10:49:05 AM Go to ED Now Yes Susy Manor, RN, Megan Caller Disagree/Comply Comply Caller Understands Yes PreDisposition InappropriateToAsk Care Advice Given Per Guideline GO TO ED NOW: * You need to be seen in the Emergency Department. * Go to the ED at ___________ [place] given per Breathing Difficulty (Adult) guideline. Comments User: Sula Soda, RN Date/Time Eilene Ghazi Time): 06/15/2021 10:49:59 AM caller states he going to speak with his wife first since she is a Therapist, sports Referrals [place]

## 2021-06-15 NOTE — Telephone Encounter (Signed)
Pt states he thinks crestor is making him SOB. Transferred to triage. Pt also stated he was supposed to get a ct scan, did not see orders in his chart but did give number for pulm referral.

## 2021-06-15 NOTE — Telephone Encounter (Signed)
Patient informed of PCP instructions. He verbalized understanding. 

## 2021-06-16 ENCOUNTER — Telehealth: Payer: Self-pay

## 2021-06-16 NOTE — Telephone Encounter (Signed)
Called the patient gave him the correct number to [facility] [phone]. Address [address] [place].

## 2021-06-16 NOTE — Telephone Encounter (Signed)
Pt called to check on status of lung cancer screening referral from 05/29/21- he was given  Pulmonary's number yesterday- he called them and was informed that they do not schedule for them. I informed Pt that we would f/u on this for him.

## 2021-07-03 ENCOUNTER — Other Ambulatory Visit: Payer: BC Managed Care – PPO

## 2021-07-16 ENCOUNTER — Other Ambulatory Visit: Payer: Self-pay | Admitting: *Deleted

## 2021-07-16 DIAGNOSIS — Z87891 Personal history of nicotine dependence: Secondary | ICD-10-CM

## 2021-07-16 DIAGNOSIS — Z122 Encounter for screening for malignant neoplasm of respiratory organs: Secondary | ICD-10-CM

## 2021-07-16 DIAGNOSIS — F17210 Nicotine dependence, cigarettes, uncomplicated: Secondary | ICD-10-CM

## 2021-07-17 ENCOUNTER — Ambulatory Visit (INDEPENDENT_AMBULATORY_CARE_PROVIDER_SITE_OTHER): Payer: BC Managed Care – PPO | Admitting: Internal Medicine

## 2021-07-17 ENCOUNTER — Encounter: Payer: Self-pay | Admitting: Internal Medicine

## 2021-07-17 VITALS — BP 128/80 | HR 86 | Temp 97.8°F | Resp 16 | Ht 67.0 in | Wt 160.2 lb

## 2021-07-17 DIAGNOSIS — R030 Elevated blood-pressure reading, without diagnosis of hypertension: Secondary | ICD-10-CM

## 2021-07-17 DIAGNOSIS — N529 Male erectile dysfunction, unspecified: Secondary | ICD-10-CM | POA: Insufficient documentation

## 2021-07-17 DIAGNOSIS — K625 Hemorrhage of anus and rectum: Secondary | ICD-10-CM | POA: Insufficient documentation

## 2021-07-17 DIAGNOSIS — Z860100 Personal history of colon polyps, unspecified: Secondary | ICD-10-CM | POA: Insufficient documentation

## 2021-07-17 DIAGNOSIS — K769 Liver disease, unspecified: Secondary | ICD-10-CM | POA: Insufficient documentation

## 2021-07-17 DIAGNOSIS — K219 Gastro-esophageal reflux disease without esophagitis: Secondary | ICD-10-CM | POA: Insufficient documentation

## 2021-07-20 ENCOUNTER — Telehealth: Payer: Self-pay | Admitting: Family Medicine

## 2021-07-20 NOTE — Telephone Encounter (Signed)
Pt called stating he was experiencing the following symptoms over the past week:  - Elevated blood pressure reading consistently around 140/90 (Pt did not have exact readings)  - Ringing in the L. Ear  -Sleepy if home  Pt was transferred to triage nurse for further eval.

## 2021-07-20 NOTE — Telephone Encounter (Signed)
Nurse Assessment Nurse: Thana Farr, RN, Ladona Ridgel Date/Time Justin Edwards Time): 07/20/2021 11:05:19 AM Confirm and document reason for call. If symptomatic, describe symptoms. ---He also has ringing in his ears. his blood pressure is 140/90. Symptoms 1.5 weeks ago. Not on BP medication. Denies SOB, chest pain, fever, headache or any other symptoms at this time. Does the patient have any new or worsening symptoms? ---Yes Will a triage be completed? ---Yes Related visit to physician within the last 2 weeks? ---No Does the PT have any chronic conditions? (i.e. diabetes, asthma, this includes High risk factors for pregnancy, etc.) ---Yes List chronic conditions. ---Seasonal Allergies Is this a behavioral health or substance abuse call? ---No Guidelines Guideline Title Affirmed Question Affirmed Notes Nurse Date/Time (Eastern Time) Blood Pressure - High [1] Systolic BP >= 130 OR Diastolic >= 80 AND [2] not taking BP medications [facility] 07/20/2021 11:06:30 AM PLEASE NOTE: All timestamps contained within this report are represented as [place] Standard Time. CONFIDENTIALTY NOTICE: This fax transmission is intended only for the addressee. It contains information that is legally privileged, confidential or otherwise protected from use or disclosure. If you are not the intended recipient, you are strictly prohibited from reviewing, disclosing, copying using or disseminating any of this information or taking any action in reliance on or regarding this information. If you have received this fax in error, please notify us immediately by telephone so that we can arrange for its return to [place]. Phone: [phone], Toll-Free: [phone], Fax: [phone] Page: 2 of 2 Call Id: [id number] Guidelines Guideline Title Affirmed Question Affirmed Notes Nurse Date/Time Justin Edwards Time) Tinnitus Symptoms only or mainly in 1 ear (unilateral tinnitus) [facility] 07/20/2021 11:07:26 AM Disp. Time  Justin Edwards Time) Disposition Final User 07/20/2021 11:07:16 AM See PCP within 2 Oletta Cohn, RN, Ladona Ridgel 07/20/2021 11:08:44 AM SEE PCP WITHIN 3 DAYS Yes Thana Farr, RN, Ursula Beath Disagree/Comply Comply Caller Understands Yes PreDisposition Call Doctor Care Advice Given Per Guideline * You need to be seen for this ongoing problem within the next 2 weeks. SEE PCP WITHIN 2 WEEKS: CALL BACK IF: * You need to be seen within 2 or 3 days. SEE PCP WITHIN 3 DAYS: CALL BACK IF: Referrals REFERRED TO PCP OFFICE

## 2021-07-21 ENCOUNTER — Ambulatory Visit (INDEPENDENT_AMBULATORY_CARE_PROVIDER_SITE_OTHER): Payer: BC Managed Care – PPO | Admitting: Family Medicine

## 2021-07-21 ENCOUNTER — Encounter: Payer: Self-pay | Admitting: Family Medicine

## 2021-07-21 VITALS — BP 126/76 | HR 90 | Ht 67.0 in | Wt 162.0 lb

## 2021-07-21 DIAGNOSIS — H9312 Tinnitus, left ear: Secondary | ICD-10-CM | POA: Diagnosis not present

## 2021-07-21 DIAGNOSIS — R030 Elevated blood-pressure reading, without diagnosis of hypertension: Secondary | ICD-10-CM

## 2021-07-27 ENCOUNTER — Ambulatory Visit: Payer: BC Managed Care – PPO | Admitting: Family Medicine

## 2021-07-29 ENCOUNTER — Other Ambulatory Visit (HOSPITAL_BASED_OUTPATIENT_CLINIC_OR_DEPARTMENT_OTHER): Payer: Self-pay

## 2021-07-29 ENCOUNTER — Encounter: Payer: Self-pay | Admitting: Family Medicine

## 2021-07-29 ENCOUNTER — Ambulatory Visit (INDEPENDENT_AMBULATORY_CARE_PROVIDER_SITE_OTHER): Payer: BC Managed Care – PPO | Admitting: Family Medicine

## 2021-07-29 VITALS — BP 128/90 | HR 49 | Temp 98.2°F | Ht 67.0 in | Wt 163.2 lb

## 2021-07-29 DIAGNOSIS — I10 Essential (primary) hypertension: Secondary | ICD-10-CM

## 2021-07-29 MED ORDER — AMLODIPINE BESYLATE 5 MG PO TABS
5.0000 mg | ORAL_TABLET | Freq: Every day | ORAL | 3 refills | Status: DC
  Filled 2021-07-29: qty 30, 30d supply, fill #0
  Filled 2021-08-26: qty 30, 30d supply, fill #1
  Filled 2021-09-22: qty 30, 30d supply, fill #2
  Filled 2021-10-14 – 2021-10-20 (×2): qty 30, 30d supply, fill #3

## 2021-07-29 NOTE — Progress Notes (Signed)
Chief Complaint  Patient presents with   Follow-up    Blood pressure     Subjective Justin Edwards is a 66 y.o. male who presents for hypertension follow up. He does monitor home blood pressures. Blood pressures ranging from 130-150's/80-100's on average. He is not on any medications He is adhering to a healthy diet overall. Current exercise: cycling No Cp or SOB.    Past Medical History:  Diagnosis Date   Hyperlipidemia    Prediabetes     Exam BP 128/90   Pulse (!) 49   Temp 98.2 F (36.8 C) (Oral)   Ht 5\' 7"  (1.702 m)   Wt 163 lb 4 oz (74 kg)   SpO2 98%   BMI 25.57 kg/m  General:  well developed, well nourished, in no apparent distress Heart: RRR, no bruits, no LE edema Lungs: clear to auscultation, no accessory muscle use Psych: well oriented with normal range of affect and appropriate judgment/insight  Essential hypertension - Plan: amLODipine (NORVASC) 5 MG tablet  Chronic, not controlled. Counseled on diet and exercise. Fortunately the pt did bring his BP monitor to the office and got 141/91. I rechecked his BP and got 134/92 making me think his monitor is accurate. Will start Norvasc 5 mg/d. Monitor BP at home.  F/u in 1 mo. The patient voiced understanding and agreement to the plan.  [place], DO 07/29/21  11:18 AM

## 2021-07-29 NOTE — Patient Instructions (Signed)
Stop smoking.  Check your blood pressures 2-3 times per week, alternating the time of day you check it. If it is high, considering waiting 1-2 minutes and rechecking. If it gets higher, your anxiety is likely creeping up and we should avoid rechecking.   Keep the diet clean and stay active.  Let us know if you need anything.

## 2021-08-21 ENCOUNTER — Encounter: Payer: BC Managed Care – PPO | Admitting: Acute Care

## 2021-08-21 ENCOUNTER — Ambulatory Visit (HOSPITAL_BASED_OUTPATIENT_CLINIC_OR_DEPARTMENT_OTHER): Payer: BC Managed Care – PPO

## 2021-08-26 ENCOUNTER — Other Ambulatory Visit (HOSPITAL_BASED_OUTPATIENT_CLINIC_OR_DEPARTMENT_OTHER): Payer: Self-pay

## 2021-09-01 ENCOUNTER — Ambulatory Visit (INDEPENDENT_AMBULATORY_CARE_PROVIDER_SITE_OTHER): Payer: BC Managed Care – PPO | Admitting: Acute Care

## 2021-09-01 ENCOUNTER — Encounter: Payer: Self-pay | Admitting: Acute Care

## 2021-09-01 DIAGNOSIS — F17210 Nicotine dependence, cigarettes, uncomplicated: Secondary | ICD-10-CM | POA: Diagnosis not present

## 2021-09-01 NOTE — Patient Instructions (Signed)
Thank you for participating in the [facility] Screening Program. It was our pleasure to meet you today. We will call you with the results of your scan within the next few days. Your scan will be assigned a Lung RADS category score by the physicians reading the scans.  This Lung RADS score determines follow up scanning.  See below for description of categories, and follow up screening recommendations. We will be in touch to schedule your follow up screening annually or based on recommendations of our providers. We will fax a copy of your scan results to your Primary Care Physician, or the physician who referred you to the program, to ensure they have the results. Please call the office if you have any questions or concerns regarding your scanning experience or results.  Our office number is [phone]. Please speak with Denise Phelps, RN. , or  Denise Buckner RN, They are  our Lung Cancer Screening RN.'s If They are unavailable when you call, Please leave a message on the voice mail. We will return your call at our earliest convenience.This voice mail is monitored several times a day.  Remember, if your scan is normal, we will scan you annually as long as you continue to meet the criteria for the program. (Age 55-77, Current smoker or smoker who has quit within the last 15 years). If you are a smoker, remember, quitting is the single most powerful action that you can take to decrease your risk of lung cancer and other pulmonary, breathing related problems. We know quitting is hard, and we are here to help.  Please let us know if there is anything we can do to help you meet your goal of quitting. If you are a former smoker, congratulations. We are proud of you! Remain smoke free! Remember you can refer friends or family members through the number above.  We will screen them to make sure they meet criteria for the program. Thank you for helping us take better care of you by  participating in Lung Screening.  You can receive free nicotine replacement therapy ( patches, gum or mints) by calling 1-800-QUIT NOW. Please call so we can get you on the path to becoming  a non-smoker. I know it is hard, but you can do this!  Lung RADS Categories:  Lung RADS 1: no nodules or definitely non-concerning nodules.  Recommendation is for a repeat annual scan in 12 months.  Lung RADS 2:  nodules that are non-concerning in appearance and behavior with a very low likelihood of becoming an active cancer. Recommendation is for a repeat annual scan in 12 months.  Lung RADS 3: nodules that are probably non-concerning , includes nodules with a low likelihood of becoming an active cancer.  Recommendation is for a 6-month repeat screening scan. Often noted after an upper respiratory illness. We will be in touch to make sure you have no questions, and to schedule your 6-month scan.  Lung RADS 4 A: nodules with concerning findings, recommendation is most often for a follow up scan in 3 months or additional testing based on our provider's assessment of the scan. We will be in touch to make sure you have no questions and to schedule the recommended 3 month follow up scan.  Lung RADS 4 B:  indicates findings that are concerning. We will be in touch with you to schedule additional diagnostic testing based on our provider's  assessment of the scan.  Other options for assistance in smoking cessation (   As covered by your insurance benefits)  Hypnosis for smoking cessation  [facility] [phone]  Acupuncture for smoking cessation  [facility] [phone]   

## 2021-09-01 NOTE — Progress Notes (Signed)
Virtual Visit via Telephone Note  I connected with Marlan Palau on 09/01/21 at  4:00 PM EDT by telephone and verified that I am speaking with the correct person using two identifiers.  Location: Patient:  At home Provider: [address], [place], [place], [address]    I discussed the limitations, risks, security and privacy concerns of performing an evaluation and management service by telephone and the availability of in person appointments. I also discussed with the patient that there may be a patient responsible charge related to this service. The patient expressed understanding and agreed to proceed.   Shared Decision Making Visit Lung Cancer Screening Program [phone])   Eligibility: Age 66 y.o. Pack Years Smoking History Calculation 46 pack year smoking history (# packs/per year x # years smoked) Recent History of coughing up blood  no Unexplained weight loss? no ( >Than 15 pounds within the last 6 months ) Prior History Lung / other cancer no (Diagnosis within the last 5 years already requiring surveillance chest CT Scans). Smoking Status Current Smoker Former Smokers: Years since quit:  NA  Quit Date:  NA  Down to one cigarette daily for the last month  Visit Components: Discussion included one or more decision making aids. yes Discussion included risk/benefits of screening. yes Discussion included potential follow up diagnostic testing for abnormal scans. yes Discussion included meaning and risk of over diagnosis. yes Discussion included meaning and risk of False Positives. yes Discussion included meaning of total radiation exposure. yes  Counseling Included: Importance of adherence to annual lung cancer LDCT screening. yes Impact of comorbidities on ability to participate in the program. yes Ability and willingness to under diagnostic treatment. yes  Smoking Cessation Counseling: Current Smokers:  Discussed importance of smoking cessation. yes Information  about tobacco cessation classes and interventions provided to patient. yes Patient provided with "ticket" for LDCT Scan. yes Symptomatic Patient. no  Counseling NA Diagnosis Code: Tobacco Use Z72.0 Asymptomatic Patient yes  Counseling (Intermediate counseling: > three minutes counseling) U9811 Former Smokers:  Discussed the importance of maintaining cigarette abstinence. yes Diagnosis Code: Personal History of Nicotine Dependence. B14.782 Information about tobacco cessation classes and interventions provided to patient. Yes Patient provided with "ticket" for LDCT Scan. yes Written Order for Lung Cancer Screening with LDCT placed in Epic. Yes (CT Chest Lung Cancer Screening Low Dose W/O CM) [id number] Z12.2-Screening of respiratory organs Z87.891-Personal history of nicotine dependence  I have spent 64 minutes of face to face/ virtual visit   time with  Mr. Febus discussing the risks and benefits of lung cancer screening. We viewed / discussed a power point together that explained in detail the above noted topics. We paused at intervals to allow for questions to be asked and answered to ensure understanding.We discussed that the single most powerful action that he can take to decrease his risk of developing lung cancer is to quit smoking. We discussed whether or not he is ready to commit to setting a quit date. We discussed options for tools to aid in quitting smoking including nicotine replacement therapy, non-nicotine medications, support groups, Quit Smart classes, and behavior modification. We discussed that often times setting smaller, more achievable goals, such as eliminating 1 cigarette a day for a week and then 2 cigarettes a day for a week can be helpful in slowly decreasing the number of cigarettes smoked. This allows for a sense of accomplishment as well as providing a clinical benefit. I provided  him  with smoking cessation  information  with contact information for community resources,  classes, free nicotine replacement therapy, and access to mobile apps, text messaging, and on-line smoking cessation help. I have also provided  him  the office contact information in the event he needs to contact me, or the screening staff. We discussed the time and location of the scan, and that either Abigail Miyamoto RN, Karlton Lemon, RN  or I will call / send a letter with the results within 24-72 hours of receiving them. The patient verbalized understanding of all of  the above and had no further questions upon leaving the office. They have my contact information in the event they have any further questions.  I spent 64 minutes counseling on smoking cessation and the health risks of continued tobacco abuse.  I explained to the patient that there has been a high incidence of coronary artery disease noted on these exams. I explained that this is a non-gated exam therefore degree or severity cannot be determined. This patient is not on statin therapy. I have asked the patient to follow-up with their PCP regarding any incidental finding of coronary artery disease and management with diet or medication as their PCP  feels is clinically indicated. The patient verbalized understanding of the above and had no further questions upon completion of the visit.      Bevelyn Ngo, NP 09/01/2021

## 2021-09-02 ENCOUNTER — Ambulatory Visit (HOSPITAL_BASED_OUTPATIENT_CLINIC_OR_DEPARTMENT_OTHER)
Admission: RE | Admit: 2021-09-02 | Discharge: 2021-09-02 | Disposition: A | Discharge status: Home / Self Care | Payer: BC Managed Care – PPO | Source: Ambulatory Visit | Attending: Acute Care | Admitting: Acute Care

## 2021-09-02 DIAGNOSIS — Z87891 Personal history of nicotine dependence: Secondary | ICD-10-CM | POA: Insufficient documentation

## 2021-09-02 DIAGNOSIS — Z122 Encounter for screening for malignant neoplasm of respiratory organs: Secondary | ICD-10-CM | POA: Insufficient documentation

## 2021-09-02 DIAGNOSIS — F17210 Nicotine dependence, cigarettes, uncomplicated: Secondary | ICD-10-CM | POA: Diagnosis not present

## 2021-09-04 ENCOUNTER — Other Ambulatory Visit: Payer: Self-pay

## 2021-09-04 DIAGNOSIS — F17210 Nicotine dependence, cigarettes, uncomplicated: Secondary | ICD-10-CM

## 2021-09-04 DIAGNOSIS — Z122 Encounter for screening for malignant neoplasm of respiratory organs: Secondary | ICD-10-CM

## 2021-09-04 DIAGNOSIS — Z87891 Personal history of nicotine dependence: Secondary | ICD-10-CM

## 2021-09-04 NOTE — Progress Notes (Signed)
Ct

## 2021-09-08 ENCOUNTER — Telehealth: Payer: BC Managed Care – PPO

## 2021-09-08 NOTE — Telephone Encounter (Signed)
Caller Name Akito Boomhower Caller Phone Number [phone] Patient Name Justin Edwards Patient DOB Dec 06, 1955 Call Type Message Only Information Provided Reason for Call Request for General Office Information Initial Comment Caller states he is needing to discuss his x-rays and scheduling. - no triage message only hours provided Disp. Time Disposition Final User 09/08/2021 7:56:45 AM General Information Provided Yes Bryon Lions, [place] Call Closed By: Richrd Humbles Transaction Date/Time: 09/08/2021 7:54:33 AM (ET)

## 2021-09-08 NOTE — Telephone Encounter (Signed)
He is referring to the CT Chest results are in his my chart.

## 2021-09-08 NOTE — Telephone Encounter (Signed)
Called the patient no answer/no VM 

## 2021-09-09 NOTE — Telephone Encounter (Signed)
Called and informed of PCP instructions. He has seen his results/wanted to know what PCP would recommend he do? He has not spoken to Pulmonary

## 2021-09-09 NOTE — Telephone Encounter (Signed)
Called and scheduled OV to discuss results with PCP(per PCP instructions.)

## 2021-09-14 ENCOUNTER — Encounter: Payer: Self-pay | Admitting: Family Medicine

## 2021-09-14 ENCOUNTER — Ambulatory Visit (INDEPENDENT_AMBULATORY_CARE_PROVIDER_SITE_OTHER): Payer: BC Managed Care – PPO | Admitting: Family Medicine

## 2021-09-14 ENCOUNTER — Other Ambulatory Visit (HOSPITAL_BASED_OUTPATIENT_CLINIC_OR_DEPARTMENT_OTHER): Payer: Self-pay

## 2021-09-14 VITALS — BP 136/88 | HR 78 | Temp 98.3°F | Ht 68.0 in | Wt 165.0 lb

## 2021-09-14 DIAGNOSIS — Z720 Tobacco use: Secondary | ICD-10-CM | POA: Insufficient documentation

## 2021-09-14 DIAGNOSIS — I700 Atherosclerosis of aorta: Secondary | ICD-10-CM | POA: Diagnosis not present

## 2021-09-14 DIAGNOSIS — R0681 Apnea, not elsewhere classified: Secondary | ICD-10-CM

## 2021-09-14 DIAGNOSIS — J439 Emphysema, unspecified: Secondary | ICD-10-CM

## 2021-09-14 MED ORDER — PRAVASTATIN SODIUM 10 MG PO TABS
10.0000 mg | ORAL_TABLET | Freq: Every day | ORAL | 5 refills | Status: DC
  Filled 2021-09-14: qty 30, 30d supply, fill #0
  Filled 2021-10-14: qty 30, 30d supply, fill #1
  Filled 2021-11-16: qty 30, 30d supply, fill #2
  Filled 2021-11-22 – 2021-12-14 (×2): qty 30, 30d supply, fill #3
  Filled 2022-01-14: qty 30, 30d supply, fill #4
  Filled 2022-01-18 – 2022-02-19 (×2): qty 30, 30d supply, fill #5

## 2021-09-14 MED ORDER — NICOTINE 14 MG/24HR TD PT24
14.0000 mg | MEDICATED_PATCH | Freq: Every day | TRANSDERMAL | 0 refills | Status: AC
  Filled 2021-09-14: qty 28, 28d supply, fill #0

## 2021-09-14 MED ORDER — ALBUTEROL SULFATE HFA 108 (90 BASE) MCG/ACT IN AERS
2.0000 | INHALATION_SPRAY | Freq: Four times a day (QID) | RESPIRATORY_TRACT | 2 refills | Status: DC | PRN
  Filled 2021-09-14: qty 6.7, 25d supply, fill #0

## 2021-09-14 MED ORDER — NICOTINE 7 MG/24HR TD PT24
7.0000 mg | MEDICATED_PATCH | Freq: Every day | TRANSDERMAL | 0 refills | Status: AC
  Filled 2021-09-14: qty 14, 14d supply, fill #0

## 2021-09-14 NOTE — Patient Instructions (Signed)
If you do not hear anything about your referral in the next 1-2 weeks, call our office and ask for an update.  Please stop smoking.  Let me know if there are cost issues.  Keep the diet clean and stay active.  Let us know if you need anything.

## 2021-09-14 NOTE — Progress Notes (Signed)
Chief Complaint  Patient presents with   Results    CT Discuss BP medication    Subjective: Patient is a 66 y.o. male here for f/u.  Patient recently had a CT chest screening for lung cancer.  No worrisome findings for lung cancer but it did show aortic atherosclerosis and emphysema.  He has been having intermittent shortness of breath for over a year now.  He is not on any inhalers routinely.  Unfortunately he did start smoking again the last few weeks.  He is currently smoking 3 to 5 cigarettes daily.  His breathing gets worse when he smokes.  Interestingly, he was on pravastatin and rosuvastatin in the past and stopped because of perceived shortness of breath.  In hindsight, he thinks it is likely related to smoking.  For a few years, the patient has been having daytime sleepiness, snoring, witnessed apneic episodes, and waking up feeling like he has not slept at all.  He is always forgot to tell me about this.  His last sleep study was in [place] around 15 years ago.  He is not on CPAP currently.  Past Medical History:  Diagnosis Date   Hyperlipidemia    Prediabetes     Objective: BP 136/88   Pulse 78   Temp 98.3 F (36.8 C) (Oral)   Ht 5\' 8"  (1.727 m)   Wt 165 lb (74.8 kg)   SpO2 98%   BMI 25.09 kg/m  General: Awake, appears stated age Heart: RRR, no LE edema Mouth: MMM, Mallampati 3 Lungs: CTAB, no rales, wheezes or rhonchi. No accessory muscle use Psych: Age appropriate judgment and insight, normal affect and mood  Assessment and Plan: Tobacco abuse - Plan: nicotine (NICODERM CQ - DOSED IN MG/24 HOURS) 14 mg/24hr patch, nicotine (NICODERM CQ - DOSED IN MG/24 HR) 7 mg/24hr patch  Witnessed apneic spells - Plan: Ambulatory referral to Pulmonology  Aortic atherosclerosis (HCC) - Plan: pravastatin (PRAVACHOL) 10 MG tablet  Pulmonary emphysema, unspecified emphysema type (HCC) - Plan: albuterol (VENTOLIN HFA) 108 (90 Base) MCG/ACT inhaler  Chronic, not controlled.  He  is improving his smoking but requested nicotine patches to help.  6 weeks of 14 mg patches sent in to be followed by 2 weeks of 7 mg patches.  He may not need these supplements. Refer to pulmonology/sleep for OSA evaluation. Start pravastatin 10 mg daily.  Would rather increase his dosage rather than start higher and have him have a bad effect again.  Counseled on diet and exercise. Commended him for tobacco cessation.  Send in albuterol to use as needed.  I would like to see him in 1 month to recheck this.  If no improvement, will start a daily long-acting muscarinic agonist. The patient voiced understanding and agreement to the plan.  [place], DO 09/14/21  4:10 PM

## 2021-09-22 ENCOUNTER — Other Ambulatory Visit (HOSPITAL_BASED_OUTPATIENT_CLINIC_OR_DEPARTMENT_OTHER): Payer: Self-pay

## 2021-10-14 ENCOUNTER — Other Ambulatory Visit (HOSPITAL_BASED_OUTPATIENT_CLINIC_OR_DEPARTMENT_OTHER): Payer: Self-pay

## 2021-10-20 ENCOUNTER — Other Ambulatory Visit (HOSPITAL_BASED_OUTPATIENT_CLINIC_OR_DEPARTMENT_OTHER): Payer: Self-pay

## 2021-11-16 ENCOUNTER — Other Ambulatory Visit (HOSPITAL_BASED_OUTPATIENT_CLINIC_OR_DEPARTMENT_OTHER): Payer: Self-pay

## 2021-11-20 ENCOUNTER — Institutional Professional Consult (permissible substitution): Payer: BC Managed Care – PPO | Admitting: Nurse Practitioner

## 2021-11-22 ENCOUNTER — Other Ambulatory Visit: Payer: Self-pay | Admitting: Family Medicine

## 2021-11-22 DIAGNOSIS — I10 Essential (primary) hypertension: Secondary | ICD-10-CM

## 2021-11-23 ENCOUNTER — Other Ambulatory Visit (HOSPITAL_BASED_OUTPATIENT_CLINIC_OR_DEPARTMENT_OTHER): Payer: Self-pay

## 2021-11-23 MED ORDER — AMLODIPINE BESYLATE 5 MG PO TABS
5.0000 mg | ORAL_TABLET | Freq: Every day | ORAL | 3 refills | Status: DC
  Filled 2021-11-23: qty 30, 30d supply, fill #0
  Filled 2021-12-14 – 2021-12-16 (×2): qty 30, 30d supply, fill #1
  Filled 2022-01-28: qty 30, 30d supply, fill #2
  Filled 2022-02-28: qty 30, 30d supply, fill #3

## 2021-11-27 ENCOUNTER — Encounter: Payer: Self-pay | Admitting: Nurse Practitioner

## 2021-11-27 ENCOUNTER — Ambulatory Visit (INDEPENDENT_AMBULATORY_CARE_PROVIDER_SITE_OTHER): Payer: BC Managed Care – PPO | Admitting: Nurse Practitioner

## 2021-11-27 VITALS — BP 138/82 | HR 92 | Ht 67.0 in | Wt 167.4 lb

## 2021-11-27 DIAGNOSIS — R0683 Snoring: Secondary | ICD-10-CM | POA: Diagnosis not present

## 2021-11-27 DIAGNOSIS — G4719 Other hypersomnia: Secondary | ICD-10-CM | POA: Diagnosis not present

## 2021-11-27 DIAGNOSIS — Z720 Tobacco use: Secondary | ICD-10-CM | POA: Diagnosis not present

## 2021-11-27 NOTE — Progress Notes (Signed)
@Patient  ID: , male    DOB: [birth year], 66 y.o.   MRN: [id number]  Chief Complaint  Patient presents with   Consult    Pt f/u he states that wife has mentioned he has apneic events and snores very loudly. Denies morning headaches and daytime tiredness. Pt has had a sleep study >30yrs ago    Referring provider: 4yr*  HPI: 66 year old male, active smoker referred for sleep consult. He is followed by the lung cancer screening program. Past medical history significant for atherosclerosis, GERD, prediabetes.   TEST/EVENTS:   11/27/2021: Today - sleep consult Patient presents today for sleep consult, referred by Dr. 13/03/2021. His wife, who is a Carmelia Roller, wanted him to seek further evaluation because he snores loudly at night and she has also noticed that he will stop breathing at times. He does have some fatigue during the day, usually after lung time. Wakes feeling rested in the mornings; although, he doesn't feel like he sleeps as well as he used to. Attributes this to age. He has gotten sleepy when driving long distances but will pull over and nap; otherwise, no drowsy driving and never fallen asleep while driving. He denies any morning headaches, sleep parasomnias/paralysis. No history of narcolepsy or cataplexy. He did have a sleep study 10 years ago; never followed up to get results and doesn't remember where this was.  He goes to bed around 11 pm. Falls asleep quickly. Wakes maybe once a night. Officially gets out of the bed around 4:30 am. No sleep aids. Drinks a few cups of coffee in the morning. Weight has been stable over the past few years. No significant cardiac history or history of stroke. He is prediabetic; not on any medication. Family history of allergies.  He lives at home with his wife. He works in Designer, jewellery for airplane seats. He smokes roughly 1/2 pack a day; usually 1 pack lasts him 2 days. He's wants to quit. Followed  by lung cancer screening program. Rarely drinks alcohol.   Epworth 6  No Known Allergies  Immunization History  Administered Date(s) Administered   PFIZER(Purple Top)SARS-COV-2 Vaccination 04/12/2019, 05/10/2019, 01/11/2020   Tdap 06/23/2017    Past Medical History:  Diagnosis Date   Hyperlipidemia    Prediabetes     Tobacco History: Social History   Tobacco Use  Smoking Status Every Day   Packs/day: 0.50   Years: 30.00   Total pack years: 15.00   Types: Cigarettes  Smokeless Tobacco Never  Tobacco Comments   1 pack every 2-3 days, 11/27/2021 [facility]   Ready to quit: Not Answered Counseling given: Not Answered Tobacco comments: 1 pack every 2-3 days, 11/27/2021 [facility]   Outpatient Medications Prior to Visit  Medication Sig Dispense Refill   albuterol (VENTOLIN HFA) 108 (90 Base) MCG/ACT inhaler Inhale 2 puffs by mouth into the lungs every 6 (six) hours as needed for wheezing or shortness of breath. 6.7 g 2   amLODipine (NORVASC) 5 MG tablet Take 1 tablet (5 mg total) by mouth daily. 30 tablet 3   pravastatin (PRAVACHOL) 10 MG tablet Take 1 tablet (10 mg total) by mouth daily. 30 tablet 5   No facility-administered medications prior to visit.     Review of Systems:   Constitutional: No weight loss or gain, night sweats, fevers, chills, or lassitude. +occasional afternoon fatigue HEENT: No headaches, difficulty swallowing, tooth/dental problems, or sore throat. No sneezing, itching, ear ache, nasal congestion, or post  nasal drip CV:  No chest pain, orthopnea, PND, swelling in lower extremities, anasarca, dizziness, palpitations, syncope Resp: +snoring, witnessed nocturnal apneas. No shortness of breath with exertion or at rest. No excess mucus or change in color of mucus. No productive or non-productive. No hemoptysis. No wheezing.  No chest wall deformity GI:  No heartburn, indigestion, abdominal pain, nausea, vomiting, diarrhea, change in bowel habits, loss of appetite,  bloody stools.  GU: No dysuria, change in color of urine, urgency or frequency.  No flank pain, no hematuria  Skin: No rash, lesions, ulcerations MSK:  No joint pain or swelling.  No decreased range of motion.  No back pain. Neuro: No dizziness or lightheadedness.  Psych: No depression or anxiety. Mood stable.     Physical Exam:  BP 138/82   Pulse 92   Ht 5\' 7"  (1.702 m)   Wt 167 lb 6.4 oz (75.9 kg)   SpO2 98%   BMI 26.22 kg/m   GEN: Pleasant, interactive, well-appearing; in no acute distress HEENT:  Normocephalic and atraumatic. PERRLA. Sclera white. Nasal turbinates pink, moist and patent bilaterally. No rhinorrhea present. Oropharynx pink and moist, without exudate or edema. No lesions, ulcerations, or postnasal drip. Mallampati II NECK:  Supple w/ fair ROM. No JVD present. Normal carotid impulses w/o bruits. Thyroid symmetrical with no goiter or nodules palpated. No lymphadenopathy.   CV: RRR, no m/r/g, no peripheral edema. Pulses intact, +2 bilaterally. No cyanosis, pallor or clubbing. PULMONARY:  Unlabored, regular breathing. Clear bilaterally A&P w/o wheezes/rales/rhonchi. No accessory muscle use.  GI: BS present and normoactive. Soft, non-tender to palpation. No organomegaly or masses detected.  MSK: No erythema, warmth or tenderness. Cap refil <2 sec all extrem. No deformities or joint swelling noted.  Neuro: A/Ox3. No focal deficits noted.   Skin: Warm, no lesions or rashe Psych: Normal affect and behavior. Judgement and thought content appropriate.     Lab Results:  CBC    Component Value Date/Time   WBC 6.5 05/22/2021 0706   RBC 5.05 05/22/2021 0706   HGB 16.3 05/22/2021 0706   HCT 48.5 05/22/2021 0706   PLT 349.0 05/22/2021 0706   MCV 96.0 05/22/2021 0706   MCH 31.4 08/31/2019 1506   MCHC 33.6 05/22/2021 0706   RDW 13.1 05/22/2021 0706    BMET    Component Value Date/Time   NA 140 05/22/2021 0706   K 4.3 05/22/2021 0706   CL 102 05/22/2021 0706   CO2  31 05/22/2021 0706   GLUCOSE 89 05/22/2021 0706   BUN 15 05/22/2021 0706   CREATININE 0.83 05/22/2021 0706   CREATININE 0.96 08/31/2019 1506   CALCIUM 9.1 05/22/2021 0706    BNP No results found for: "BNP"   Imaging:  No results found.        No data to display          No results found for: "NITRICOXIDE"      Assessment & Plan:   Loud snoring He has snoring, nocturnal apneic events, afternoon fatigue. Epworth 6. Given this,  I am concerned he could have sleep disordered breathing with obstructive sleep apnea. He will need sleep study for further evaluation.    - discussed how weight can impact sleep and risk for sleep disordered breathing - discussed options to assist with weight loss: combination of diet modification, cardiovascular and strength training exercises   - had an extensive discussion regarding the adverse health consequences related to untreated sleep disordered breathing - specifically discussed the risks  for hypertension, coronary artery disease, cardiac dysrhythmias, cerebrovascular disease, and diabetes - lifestyle modification discussed   - discussed how sleep disruption can increase risk of accidents, particularly when driving - safe driving practices were discussed  Patient Instructions  Given your symptoms, I am concerned that you may have sleep disordered breathing with sleep apnea.  I have ordered a home sleep study for further evaluation.  Someone will contact you for scheduling.  We discussed how untreated sleep apnea puts an individual at risk for cardiac arrhthymias, pulm HTN, DM, stroke and increases their risk for daytime accidents. We also briefly reviewed treatment options including weight loss, side sleeping position, oral appliance, CPAP therapy or referral to ENT for possible surgical options.  Cautioned to be aware of drowsy driving and pull over if you feel sleepy.  Follow up in 12 weeks with Katie Eliya Bubar,NP to discuss sleep study  results. If you have not had your sleep study yet, please call to reschedule to a later date.    Tobacco abuse Smoking cessation advised. He is followed by lung cancer screening program.    I spent 35 minutes of dedicated to the care of this patient on the date of this encounter to include pre-visit review of records, face-to-face time with the patient discussing conditions above, post visit ordering of testing, clinical documentation with the electronic health record, making appropriate referrals as documented, and communicating necessary findings to members of the patients care team.  Clayton Bibles, NP 11/27/2021  Pt aware and understands NP's role.

## 2021-11-27 NOTE — Progress Notes (Signed)
Reviewed and agree with assessment/plan.   Dereon Williamsen, MD [facility] 11/27/2021, 5:19 PM Pager:  [phone]  

## 2021-11-27 NOTE — Assessment & Plan Note (Signed)
Smoking cessation advised. He is followed by lung cancer screening program.

## 2021-11-27 NOTE — Patient Instructions (Addendum)
Given your symptoms, I am concerned that you may have sleep disordered breathing with sleep apnea.  I have ordered a home sleep study for further evaluation.  Someone will contact you for scheduling.  We discussed how untreated sleep apnea puts an individual at risk for cardiac arrhthymias, pulm HTN, DM, stroke and increases their risk for daytime accidents. We also briefly reviewed treatment options including weight loss, side sleeping position, oral appliance, CPAP therapy or referral to ENT for possible surgical options.  Cautioned to be aware of drowsy driving and pull over if you feel sleepy.  Follow up in 12 weeks with Katie Keilin Gamboa,NP to discuss sleep study results. If you have not had your sleep study yet, please call to reschedule to a later date.

## 2021-11-27 NOTE — Assessment & Plan Note (Addendum)
He has snoring, nocturnal apneic events, afternoon fatigue. Epworth 6. Given this,  I am concerned he could have sleep disordered breathing with obstructive sleep apnea. He will need sleep study for further evaluation.    - discussed how weight can impact sleep and risk for sleep disordered breathing - discussed options to assist with weight loss: combination of diet modification, cardiovascular and strength training exercises   - had an extensive discussion regarding the adverse health consequences related to untreated sleep disordered breathing - specifically discussed the risks for hypertension, coronary artery disease, cardiac dysrhythmias, cerebrovascular disease, and diabetes - lifestyle modification discussed   - discussed how sleep disruption can increase risk of accidents, particularly when driving - safe driving practices were discussed  Patient Instructions  Given your symptoms, I am concerned that you may have sleep disordered breathing with sleep apnea.  I have ordered a home sleep study for further evaluation.  Someone will contact you for scheduling.  We discussed how untreated sleep apnea puts an individual at risk for cardiac arrhthymias, pulm HTN, DM, stroke and increases their risk for daytime accidents. We also briefly reviewed treatment options including weight loss, side sleeping position, oral appliance, CPAP therapy or referral to ENT for possible surgical options.  Cautioned to be aware of drowsy driving and pull over if you feel sleepy.  Follow up in 12 weeks with Katie Cailynn Bodnar,NP to discuss sleep study results. If you have not had your sleep study yet, please call to reschedule to a later date.

## 2021-12-14 ENCOUNTER — Other Ambulatory Visit (HOSPITAL_BASED_OUTPATIENT_CLINIC_OR_DEPARTMENT_OTHER): Payer: Self-pay

## 2021-12-14 ENCOUNTER — Telehealth: Payer: Self-pay | Admitting: Family Medicine

## 2021-12-14 NOTE — Telephone Encounter (Signed)
Patient informed/he will not do referral at this time

## 2021-12-14 NOTE — Telephone Encounter (Signed)
Pt called stating that he is having an issues with ringing in his ears and wanted to have a referral put in for an ENT specialist. Please Advise.

## 2021-12-15 ENCOUNTER — Other Ambulatory Visit (HOSPITAL_BASED_OUTPATIENT_CLINIC_OR_DEPARTMENT_OTHER): Payer: Self-pay

## 2021-12-15 MED ORDER — OMEPRAZOLE 20 MG PO CPDR
20.0000 mg | DELAYED_RELEASE_CAPSULE | Freq: Every day | ORAL | 1 refills | Status: DC
  Filled 2021-12-15: qty 90, 90d supply, fill #0
  Filled 2022-03-10: qty 90, 90d supply, fill #1

## 2021-12-15 MED ORDER — OMEPRAZOLE 20 MG PO CPDR
20.0000 mg | DELAYED_RELEASE_CAPSULE | Freq: Two times a day (BID) | ORAL | 1 refills | Status: DC
  Filled 2021-12-15 – 2022-01-14 (×3): qty 180, 90d supply, fill #0
  Filled 2022-03-25: qty 180, 90d supply, fill #1

## 2021-12-16 ENCOUNTER — Other Ambulatory Visit (HOSPITAL_BASED_OUTPATIENT_CLINIC_OR_DEPARTMENT_OTHER): Payer: Self-pay

## 2021-12-21 ENCOUNTER — Other Ambulatory Visit (HOSPITAL_BASED_OUTPATIENT_CLINIC_OR_DEPARTMENT_OTHER): Payer: Self-pay

## 2022-01-04 ENCOUNTER — Other Ambulatory Visit (HOSPITAL_BASED_OUTPATIENT_CLINIC_OR_DEPARTMENT_OTHER): Payer: Self-pay

## 2022-01-14 ENCOUNTER — Other Ambulatory Visit: Payer: Self-pay

## 2022-01-14 ENCOUNTER — Other Ambulatory Visit (HOSPITAL_BASED_OUTPATIENT_CLINIC_OR_DEPARTMENT_OTHER): Payer: Self-pay

## 2022-01-15 DIAGNOSIS — B9681 Helicobacter pylori [H. pylori] as the cause of diseases classified elsewhere: Secondary | ICD-10-CM | POA: Diagnosis not present

## 2022-01-19 ENCOUNTER — Other Ambulatory Visit (HOSPITAL_BASED_OUTPATIENT_CLINIC_OR_DEPARTMENT_OTHER): Payer: Self-pay

## 2022-01-29 ENCOUNTER — Other Ambulatory Visit (HOSPITAL_BASED_OUTPATIENT_CLINIC_OR_DEPARTMENT_OTHER): Payer: Self-pay

## 2022-02-19 ENCOUNTER — Other Ambulatory Visit (HOSPITAL_BASED_OUTPATIENT_CLINIC_OR_DEPARTMENT_OTHER): Payer: Self-pay

## 2022-02-26 ENCOUNTER — Ambulatory Visit: Payer: BC Managed Care – PPO | Admitting: Adult Health

## 2022-02-26 DIAGNOSIS — G4719 Other hypersomnia: Secondary | ICD-10-CM

## 2022-02-26 DIAGNOSIS — G4733 Obstructive sleep apnea (adult) (pediatric): Secondary | ICD-10-CM

## 2022-03-01 DIAGNOSIS — K219 Gastro-esophageal reflux disease without esophagitis: Secondary | ICD-10-CM | POA: Diagnosis not present

## 2022-03-02 DIAGNOSIS — G4733 Obstructive sleep apnea (adult) (pediatric): Secondary | ICD-10-CM | POA: Diagnosis not present

## 2022-03-03 NOTE — Progress Notes (Signed)
Called and spoke with patient , advised of results/recommendations per Roxan Diesel.  He verbalized understanding.  Scheduled f/u for 03/05/22 at 2 pm.  Nothing further needed.

## 2022-03-03 NOTE — Progress Notes (Signed)
Please notify patient that his sleep study showed severe OSA with AHI 47/h, SpO2 low 83%. Let's move his follow up ASAP to discuss treatment options. Thanks!

## 2022-03-05 ENCOUNTER — Ambulatory Visit (INDEPENDENT_AMBULATORY_CARE_PROVIDER_SITE_OTHER): Payer: BC Managed Care – PPO | Admitting: Nurse Practitioner

## 2022-03-05 ENCOUNTER — Encounter: Payer: Self-pay | Admitting: Nurse Practitioner

## 2022-03-05 VITALS — BP 132/84 | HR 94 | Temp 97.8°F | Ht 67.0 in | Wt 163.8 lb

## 2022-03-05 DIAGNOSIS — G4733 Obstructive sleep apnea (adult) (pediatric): Secondary | ICD-10-CM | POA: Insufficient documentation

## 2022-03-05 NOTE — Patient Instructions (Signed)
Your sleep study showed you have severe sleep apnea. We discussed how untreated sleep apnea puts an individual at risk for cardiac arrhthymias, pulm HTN, DM, stroke and increases their risk for daytime accidents. We also briefly reviewed treatment options including weight loss, side sleeping position, oral appliance, CPAP therapy or referral to ENT for possible surgical options. We discussed that with severe sleep apnea, the best non-invasive form of treatment is CPAP therapy.   Start CPAP 5-20 cmH2O every night, minimum of 4-6 hours a night.  Change equipment every 30 days or as directed by DME. Wash your tubing with warm soap and water daily, hang to dry. Wash humidifier portion weekly.  Be aware of reduced alertness and do not drive or operate heavy machinery if experiencing this or drowsiness.  Notify if persistent daytime sleepiness occurs even with consistent use of CPAP.  The medical supply company will call you regarding your CPAP   Follow up in 12 weeks with Dr. Ander Slade (new pt 30 min slot) or Katie Gwendoline Judy,NP, or sooner if needed

## 2022-03-05 NOTE — Progress Notes (Signed)
$@PatientM$  ID: Justin Edwards, male    DOB: Oct 22, 1955, 66 y.o.   MRN: [id number]  Chief Complaint  Patient presents with   Follow-up    Discuss HST and tx options    Referring provider: Shelda Pal*  HPI: 67 year old male, active smoker referred for sleep consult November 2023. He is followed by the lung cancer screening program. Past medical history significant for atherosclerosis, GERD, prediabetes.   TEST/EVENTS:  02/28/2022 HST: AHI 47/h, SpO2 low 83%  11/27/2021: OV with April Carlyon NP for sleep consult, referred by Dr. Nani Ravens. His wife, who is a Equities trader, wanted him to seek further evaluation because he snores loudly at night and she has also noticed that he will stop breathing at times. He does have some fatigue during the day, usually after lunch time. Wakes feeling rested in the mornings; although, he doesn't feel like he sleeps as well as he used to. Attributes this to age. He has gotten sleepy when driving long distances but will pull over and nap; otherwise, no drowsy driving and never fallen asleep while driving. He denies any morning headaches, sleep parasomnias/paralysis. No history of narcolepsy or cataplexy. He did have a sleep study 10 years ago; never followed up to get results and doesn't remember where this was.  He goes to bed around 11 pm. Falls asleep quickly. Wakes maybe once a night. Officially gets out of the bed around 4:30 am. No sleep aids. Drinks a few cups of coffee in the morning. Weight has been stable over the past few years. No significant cardiac history or history of stroke. He is prediabetic; not on any medication. Family history of allergies.  He lives at home with his wife. He works in Field seismologist for airplane seats. He smokes roughly 1/2 pack a day; usually 1 pack lasts him 2 days. He's wants to quit. Followed by lung cancer screening program. Rarely drinks alcohol.  Epworth 6  03/05/2022: Today - follow up Patient presents  today for follow-up after undergoing home sleep study which revealed severe OSA with AHI 47/h.  He feels relatively unchanged compared to when he was here last.  Continues to have some mild daytime fatigue symptoms, especially after lunch, and loud snoring at night. Denies drowsy driving or sleep parasomnias/paralysis.   No Known Allergies  Immunization History  Administered Date(s) Administered   PFIZER(Purple Top)SARS-COV-2 Vaccination 04/12/2019, 05/10/2019, 01/11/2020   Tdap 06/23/2017    Past Medical History:  Diagnosis Date   Hyperlipidemia    Prediabetes     Tobacco History: Social History   Tobacco Use  Smoking Status Every Day   Packs/day: 0.50   Years: 30.00   Total pack years: 15.00   Types: Cigarettes  Smokeless Tobacco Never  Tobacco Comments   1 pack every 2-3 days, 03/05/22 am   Ready to quit: Not Answered Counseling given: Not Answered Tobacco comments: 1 pack every 2-3 days, 03/05/22 am   Outpatient Medications Prior to Visit  Medication Sig Dispense Refill   albuterol (VENTOLIN HFA) 108 (90 Base) MCG/ACT inhaler Inhale 2 puffs by mouth into the lungs every 6 (six) hours as needed for wheezing or shortness of breath. 6.7 g 2   amLODipine (NORVASC) 5 MG tablet Take 1 tablet (5 mg total) by mouth daily. 30 tablet 3   omeprazole (PRILOSEC) 20 MG capsule Take 1 capsule (20 mg total) by mouth 2 (two) times daily 30 minutes before a meal. (Patient taking differently: Take 20 mg by  mouth daily as needed (for reflux).) 180 capsule 1   pravastatin (PRAVACHOL) 10 MG tablet Take 1 tablet (10 mg total) by mouth daily. 30 tablet 5   omeprazole (PRILOSEC) 20 MG capsule Take 1 capsule (20 mg total) by mouth daily 30 minutes before breakfast. (Patient not taking: Reported on 03/05/2022) 90 capsule 1   No facility-administered medications prior to visit.     Review of Systems:   Constitutional: No weight loss or gain, night sweats, fevers, chills, or lassitude. +occasional  afternoon fatigue HEENT: No headaches, difficulty swallowing, tooth/dental problems, or sore throat. No sneezing, itching, ear ache, nasal congestion, or post nasal drip CV:  No chest pain, orthopnea, PND, swelling in lower extremities, anasarca, dizziness, palpitations, syncope Resp: +snoring, witnessed nocturnal apneas. No shortness of breath with exertion or at rest. No excess mucus or change in color of mucus. No productive or non-productive. No hemoptysis. No wheezing.  No chest wall deformity GI:  No heartburn, indigestion, abdominal pain, nausea, vomiting, diarrhea, change in bowel habits, loss of appetite, bloody stools.  GU: No dysuria, change in color of urine, urgency or frequency.  No flank pain, no hematuria  Skin: No rash, lesions, ulcerations MSK:  No joint pain or swelling.  No decreased range of motion.  No back pain. Neuro: No dizziness or lightheadedness.  Psych: No depression or anxiety. Mood stable.     Physical Exam:  BP 132/84 (BP Location: Right Arm, Patient Position: Sitting, Cuff Size: Normal)   Pulse 94   Temp 97.8 F (36.6 C) (Oral)   Ht 5' 7"$  (1.702 m)   Wt 163 lb 12.8 oz (74.3 kg)   SpO2 99%   BMI 25.65 kg/m   GEN: Pleasant, interactive, well-appearing; in no acute distress HEENT:  Normocephalic and atraumatic. PERRLA. Sclera white. Nasal turbinates pink, moist and patent bilaterally. No rhinorrhea present. Oropharynx pink and moist, without exudate or edema. No lesions, ulcerations, or postnasal drip. Mallampati II NECK:  Supple w/ fair ROM. No JVD present. Normal carotid impulses w/o bruits. Thyroid symmetrical with no goiter or nodules palpated. No lymphadenopathy.   CV: RRR, no m/r/g, no peripheral edema. Pulses intact, +2 bilaterally. No cyanosis, pallor or clubbing. PULMONARY:  Unlabored, regular breathing. Clear bilaterally A&P w/o wheezes/rales/rhonchi. No accessory muscle use.  GI: BS present and normoactive. Soft, non-tender to palpation. No  organomegaly or masses detected.  MSK: No erythema, warmth or tenderness. Cap refil <2 sec all extrem. No deformities or joint swelling noted.  Neuro: A/Ox3. No focal deficits noted.   Skin: Warm, no lesions or rashe Psych: Normal affect and behavior. Judgement and thought content appropriate.     Lab Results:  CBC    Component Value Date/Time   WBC 6.5 05/22/2021 0706   RBC 5.05 05/22/2021 0706   HGB 16.3 05/22/2021 0706   HCT 48.5 05/22/2021 0706   PLT 349.0 05/22/2021 0706   MCV 96.0 05/22/2021 0706   MCH 31.4 08/31/2019 1506   MCHC 33.6 05/22/2021 0706   RDW 13.1 05/22/2021 0706    BMET    Component Value Date/Time   NA 140 05/22/2021 0706   K 4.3 05/22/2021 0706   CL 102 05/22/2021 0706   CO2 31 05/22/2021 0706   GLUCOSE 89 05/22/2021 0706   BUN 15 05/22/2021 0706   CREATININE 0.83 05/22/2021 0706   CREATININE 0.96 08/31/2019 1506   CALCIUM 9.1 05/22/2021 0706    BNP No results found for: "BNP"   Imaging:  No results found.  No data to display          No results found for: "NITRICOXIDE"      Assessment & Plan:   Severe obstructive sleep apnea Severe OSA with AHI 47/h. We discussed treatment options today. Shared decision to move forward with CPAP therapy. We will start him on auto CPAP 5-20 cmH2O, mask of choice and heated humidification. Educated on proper use/care. Reviewed benefits/risks with use. Cautioned on safe driving practices.  Patient Instructions  Your sleep study showed you have severe sleep apnea. We discussed how untreated sleep apnea puts an individual at risk for cardiac arrhthymias, pulm HTN, DM, stroke and increases their risk for daytime accidents. We also briefly reviewed treatment options including weight loss, side sleeping position, oral appliance, CPAP therapy or referral to ENT for possible surgical options. We discussed that with severe sleep apnea, the best non-invasive form of treatment is CPAP therapy.    Start CPAP 5-20 cmH2O every night, minimum of 4-6 hours a night.  Change equipment every 30 days or as directed by DME. Wash your tubing with warm soap and water daily, hang to dry. Wash humidifier portion weekly.  Be aware of reduced alertness and do not drive or operate heavy machinery if experiencing this or drowsiness.  Notify if persistent daytime sleepiness occurs even with consistent use of CPAP.  The medical supply company will call you regarding your CPAP   Follow up in 12 weeks with Dr. Ander Slade (new pt 30 min slot) or Katie Ashey Tramontana,NP, or sooner if needed     I spent 32 minutes of dedicated to the care of this patient on the date of this encounter to include pre-visit review of records, face-to-face time with the patient discussing conditions above, post visit ordering of testing, clinical documentation with the electronic health record, making appropriate referrals as documented, and communicating necessary findings to members of the patients care team.  Clayton Bibles, NP 03/05/2022  Pt aware and understands NP's role.

## 2022-03-05 NOTE — Assessment & Plan Note (Signed)
Severe OSA with AHI 47/h. We discussed treatment options today. Shared decision to move forward with CPAP therapy. We will start him on auto CPAP 5-20 cmH2O, mask of choice and heated humidification. Educated on proper use/care. Reviewed benefits/risks with use. Cautioned on safe driving practices.  Patient Instructions  Your sleep study showed you have severe sleep apnea. We discussed how untreated sleep apnea puts an individual at risk for cardiac arrhthymias, pulm HTN, DM, stroke and increases their risk for daytime accidents. We also briefly reviewed treatment options including weight loss, side sleeping position, oral appliance, CPAP therapy or referral to ENT for possible surgical options. We discussed that with severe sleep apnea, the best non-invasive form of treatment is CPAP therapy.   Start CPAP 5-20 cmH2O every night, minimum of 4-6 hours a night.  Change equipment every 30 days or as directed by DME. Wash your tubing with warm soap and water daily, hang to dry. Wash humidifier portion weekly.  Be aware of reduced alertness and do not drive or operate heavy machinery if experiencing this or drowsiness.  Notify if persistent daytime sleepiness occurs even with consistent use of CPAP.  The medical supply company will call you regarding your CPAP   Follow up in 12 weeks with Dr. Ander Slade (new pt 30 min slot) or Katie Ninoshka Wainwright,NP, or sooner if needed

## 2022-03-08 ENCOUNTER — Other Ambulatory Visit (HOSPITAL_BASED_OUTPATIENT_CLINIC_OR_DEPARTMENT_OTHER): Payer: Self-pay

## 2022-03-08 ENCOUNTER — Encounter: Payer: Self-pay | Admitting: Family Medicine

## 2022-03-08 ENCOUNTER — Ambulatory Visit (INDEPENDENT_AMBULATORY_CARE_PROVIDER_SITE_OTHER): Payer: BC Managed Care – PPO | Admitting: Family Medicine

## 2022-03-08 VITALS — BP 130/84 | HR 95 | Temp 98.1°F | Ht 67.0 in | Wt 164.2 lb

## 2022-03-08 DIAGNOSIS — I10 Essential (primary) hypertension: Secondary | ICD-10-CM | POA: Diagnosis not present

## 2022-03-08 MED ORDER — AMLODIPINE BESYLATE 10 MG PO TABS
10.0000 mg | ORAL_TABLET | Freq: Every day | ORAL | 3 refills | Status: DC
  Filled 2022-03-08: qty 30, 30d supply, fill #0
  Filled 2022-04-02: qty 30, 30d supply, fill #1
  Filled 2022-05-03 – 2022-05-12 (×2): qty 30, 30d supply, fill #2
  Filled 2022-06-04: qty 30, 30d supply, fill #3

## 2022-03-08 NOTE — Progress Notes (Signed)
Chief Complaint  Patient presents with   Hypertension    Subjective Justin Edwards is a 67 y.o. male who presents for HTN f/u He does not routinely monitor home blood pressures. BP 160/100 at sleep team's office. 130/92 at home.  He is compliant with medication- Norvasc 5 mg/d. Patient has these side effects of medication: none He is sometimes adhering to a healthy diet overall. Current exercise: none No Cp or SOB.    Past Medical History:  Diagnosis Date   Hyperlipidemia    Prediabetes     Exam BP 130/84 (BP Location: Left Arm, Cuff Size: Normal)   Pulse 95   Temp 98.1 F (36.7 C) (Oral)   Ht 5' 7"$  (1.702 m)   Wt 164 lb 4 oz (74.5 kg)   SpO2 99%   BMI 25.73 kg/m  General:  well developed, well nourished, in no apparent distress Heart: RRR, no bruits, no LE edema Lungs: clear to auscultation, no accessory muscle use Psych: well oriented with normal range of affect and appropriate judgment/insight  Essential hypertension - Plan: amLODipine (NORVASC) 10 MG tablet  Chronic, not fully controlled. Increase Norvasc to 10 mg/d. Monitor BP at home. Discussed proper monitoring tech. He has validated his home monitor in our office.  Counseled on diet and exercise.  He needs to get back to exercising more. F/u in 1 month. The patient voiced understanding and agreement to the plan.  [place], DO 03/08/22  7:25 AM

## 2022-03-08 NOTE — Patient Instructions (Addendum)
Check your blood pressures 2-3 times per week, alternating the time of day you check it. If it is high, considering waiting 1-2 minutes and rechecking. If it gets higher, your anxiety is likely creeping up and we should avoid rechecking.   When you check your BP, make sure you have been doing something calm/relaxing 5 minutes prior to checking. Both feet should be flat on the floor and you should be sitting. Use your left arm and make sure it is in a relaxed position (on a table), and that the cuff is at the approximate level/height of your heart.   I want your blood pressure less than 140 on the top and less than 90 on the bottom consistently. Both goals must be met (ie, 150/70 is too high even though the 70 on the bottom is desirable).   Keep the diet clean and stay active.  Aim to do some physical exertion for 150 minutes per week. This is typically divided into 5 days per week, 30 minutes per day. The activity should be enough to get your heart rate up. Anything is better than nothing if you have time constraints.  Let us know if you need anything.

## 2022-03-19 ENCOUNTER — Ambulatory Visit: Payer: BC Managed Care – PPO | Admitting: Nurse Practitioner

## 2022-03-23 ENCOUNTER — Other Ambulatory Visit: Payer: Self-pay | Admitting: Family Medicine

## 2022-03-23 DIAGNOSIS — I700 Atherosclerosis of aorta: Secondary | ICD-10-CM

## 2022-03-24 ENCOUNTER — Other Ambulatory Visit (HOSPITAL_BASED_OUTPATIENT_CLINIC_OR_DEPARTMENT_OTHER): Payer: Self-pay

## 2022-03-24 MED ORDER — PRAVASTATIN SODIUM 10 MG PO TABS
10.0000 mg | ORAL_TABLET | Freq: Every day | ORAL | 5 refills | Status: DC
  Filled 2022-03-24: qty 30, 30d supply, fill #0
  Filled 2022-04-19 – 2022-04-27 (×2): qty 30, 30d supply, fill #1
  Filled 2022-05-21: qty 30, 30d supply, fill #2
  Filled 2022-06-21: qty 30, 30d supply, fill #3
  Filled 2022-07-20: qty 30, 30d supply, fill #4
  Filled 2022-08-18: qty 30, 30d supply, fill #5

## 2022-03-25 ENCOUNTER — Other Ambulatory Visit (HOSPITAL_BASED_OUTPATIENT_CLINIC_OR_DEPARTMENT_OTHER): Payer: Self-pay

## 2022-04-26 ENCOUNTER — Other Ambulatory Visit (HOSPITAL_BASED_OUTPATIENT_CLINIC_OR_DEPARTMENT_OTHER): Payer: Self-pay

## 2022-05-12 ENCOUNTER — Other Ambulatory Visit (HOSPITAL_BASED_OUTPATIENT_CLINIC_OR_DEPARTMENT_OTHER): Payer: Self-pay

## 2022-05-21 ENCOUNTER — Ambulatory Visit (INDEPENDENT_AMBULATORY_CARE_PROVIDER_SITE_OTHER): Payer: BC Managed Care – PPO | Admitting: Family Medicine

## 2022-05-21 ENCOUNTER — Encounter: Payer: Self-pay | Admitting: Family Medicine

## 2022-05-21 VITALS — BP 120/78 | HR 57 | Temp 98.4°F | Ht 67.0 in | Wt 162.0 lb

## 2022-05-21 DIAGNOSIS — R7303 Prediabetes: Secondary | ICD-10-CM

## 2022-05-21 DIAGNOSIS — Z0000 Encounter for general adult medical examination without abnormal findings: Secondary | ICD-10-CM

## 2022-05-21 DIAGNOSIS — Z125 Encounter for screening for malignant neoplasm of prostate: Secondary | ICD-10-CM | POA: Diagnosis not present

## 2022-05-21 NOTE — Patient Instructions (Signed)
Give us 2-3 business days to get the results of your labs back.   Keep the diet clean and stay active.  Please get me a copy of your advanced directive form at your convenience.   Let us know if you need anything.  

## 2022-05-21 NOTE — Progress Notes (Signed)
Chief Complaint  Patient presents with   Annual Exam    Well Male Justin Edwards is here for a complete physical.   His last physical was >1 year ago.  Current diet: in general, diet is fair.   Current exercise: not lately; cycling and lifting usually Weight trend: stable Fatigue out of ordinary? No. Seat belt? Yes.   Advanced directive? No  Health maintenance Shingrix- No Colonoscopy- Yes Tetanus- Yes Hep C- Yes Lung cancer screening- Yes Pneumonia vaccine- No AAA screening- No  Past Medical History:  Diagnosis Date   Hyperlipidemia    Prediabetes      History reviewed. No pertinent surgical history.  Medications  Current Outpatient Medications on File Prior to Visit  Medication Sig Dispense Refill   albuterol (VENTOLIN HFA) 108 (90 Base) MCG/ACT inhaler Inhale 2 puffs by mouth into the lungs every 6 (six) hours as needed for wheezing or shortness of breath. 6.7 g 2   amLODipine (NORVASC) 10 MG tablet Take 1 tablet (10 mg total) by mouth daily. 30 tablet 3   omeprazole (PRILOSEC) 20 MG capsule Take 1 capsule (20 mg total) by mouth 2 (two) times daily 30 minutes before a meal. (Patient taking differently: Take 20 mg by mouth daily as needed (for reflux).) 180 capsule 1   omeprazole (PRILOSEC) 20 MG capsule Take 1 capsule (20 mg total) by mouth daily 30 minutes before breakfast. 90 capsule 1   pravastatin (PRAVACHOL) 10 MG tablet Take 1 tablet (10 mg total) by mouth daily. 30 tablet 5   Allergies No Known Allergies  Family History Family History  Problem Relation Age of Onset   Anxiety disorder Father     Review of Systems: Constitutional:  no fevers Eye:  no recent significant change in vision Ears:  No changes in hearing Nose/Mouth/Throat:  no complaints of nasal congestion, no sore throat Cardiovascular: no chest pain Respiratory:  No shortness of breath Gastrointestinal:  No change in bowel habits GU:  No frequency Integumentary:  no abnormal skin  lesions reported Neurologic:  no headaches Endocrine:  denies unexplained weight changes  Exam BP 120/78 (BP Location: Left Arm, Patient Position: Sitting, Cuff Size: Normal)   Pulse (!) 57   Temp 98.4 F (36.9 C) (Oral)   Ht 5\' 7"  (1.702 m)   Wt 162 lb (73.5 kg)   SpO2 99%   BMI 25.37 kg/m  General:  well developed, well nourished, in no apparent distress Skin:  no significant moles, warts, or growths Head:  no masses, lesions, or tenderness Eyes:  pupils equal and round, sclera anicteric without injection Ears:  canals without lesions, TMs shiny without retraction, no obvious effusion, no erythema Nose:  nares patent, mucosa normal Throat/Pharynx:  lips and gingiva without lesion; tongue and uvula midline; non-inflamed pharynx; no exudates or postnasal drainage Lungs:  clear to auscultation, breath sounds equal bilaterally, no respiratory distress Cardio:  regular rate and rhythm, no LE edema or bruits Rectal: Deferred GI: BS+, S, NT, ND, no masses or organomegaly Musculoskeletal:  symmetrical muscle groups noted without atrophy or deformity Neuro:  gait normal; deep tendon reflexes normal and symmetric Psych: well oriented with normal range of affect and appropriate judgment/insight  Assessment and Plan  Well adult exam - Plan: CBC, Comprehensive metabolic panel, Lipid panel  Prediabetes - Plan: Hemoglobin A1c  Screening for prostate cancer - Plan: PSA   Well 67 y.o. male. Counseled on diet and exercise. Advanced directive form provided today.  Other orders as above.  Interesting situation with his 67 yo screenings. Reports his actual age is 29 as when he came to the [place], he was 37 but required to be 27 to leave his country to work. In light of this, will recommend AAA screening and PCV20 when he turns 70.  Follow up in 6 mo. Fasting labs at convenience.   The patient voiced understanding and agreement to the plan.  Jilda Roche [place], DO 05/21/22 7:16 AM

## 2022-06-18 ENCOUNTER — Other Ambulatory Visit (INDEPENDENT_AMBULATORY_CARE_PROVIDER_SITE_OTHER): Payer: BC Managed Care – PPO

## 2022-06-18 DIAGNOSIS — R7303 Prediabetes: Secondary | ICD-10-CM

## 2022-06-18 DIAGNOSIS — Z125 Encounter for screening for malignant neoplasm of prostate: Secondary | ICD-10-CM

## 2022-06-18 DIAGNOSIS — Z0000 Encounter for general adult medical examination without abnormal findings: Secondary | ICD-10-CM

## 2022-06-18 LAB — CBC
HCT: 47.3 % (ref 39.0–52.0)
Hemoglobin: 15.6 g/dL (ref 13.0–17.0)
MCHC: 32.9 g/dL (ref 30.0–36.0)
MCV: 94.2 fl (ref 78.0–100.0)
Platelets: 361 10*3/uL (ref 150.0–400.0)
RBC: 5.02 Mil/uL (ref 4.22–5.81)
RDW: 13.9 % (ref 11.5–15.5)
WBC: 5.9 10*3/uL (ref 4.0–10.5)

## 2022-06-18 LAB — COMPREHENSIVE METABOLIC PANEL
ALT: 31 U/L (ref 0–53)
AST: 22 U/L (ref 0–37)
Albumin: 4.3 g/dL (ref 3.5–5.2)
Alkaline Phosphatase: 50 U/L (ref 39–117)
BUN: 18 mg/dL (ref 6–23)
CO2: 30 mEq/L (ref 19–32)
Calcium: 9.3 mg/dL (ref 8.4–10.5)
Chloride: 104 mEq/L (ref 96–112)
Creatinine, Ser: 0.8 mg/dL (ref 0.40–1.50)
GFR: 93.03 mL/min (ref >60.00)
Glucose, Bld: 96 mg/dL (ref 70–99)
Potassium: 4.1 mEq/L (ref 3.5–5.1)
Sodium: 142 mEq/L (ref 135–145)
Total Bilirubin: 0.5 mg/dL (ref 0.2–1.2)
Total Protein: 7 g/dL (ref 6.0–8.3)

## 2022-06-18 LAB — HEMOGLOBIN A1C: Hgb A1c MFr Bld: 6.3 % (ref 4.6–6.5)

## 2022-06-18 LAB — LIPID PANEL
Cholesterol: 178 mg/dL (ref 0–200)
HDL: 45.8 mg/dL (ref >39.00)
LDL Cholesterol: 111 mg/dL — ABNORMAL HIGH (ref 0–99)
NonHDL: 132.14
Total CHOL/HDL Ratio: 4
Triglycerides: 107 mg/dL (ref 0.0–149.0)
VLDL: 21.4 mg/dL (ref 0.0–40.0)

## 2022-06-18 LAB — PSA: PSA: 1.03 ng/mL (ref 0.10–4.00)

## 2022-06-26 ENCOUNTER — Other Ambulatory Visit: Payer: Self-pay | Admitting: Acute Care

## 2022-06-26 DIAGNOSIS — Z87891 Personal history of nicotine dependence: Secondary | ICD-10-CM

## 2022-06-26 DIAGNOSIS — Z122 Encounter for screening for malignant neoplasm of respiratory organs: Secondary | ICD-10-CM

## 2022-06-26 DIAGNOSIS — F17210 Nicotine dependence, cigarettes, uncomplicated: Secondary | ICD-10-CM

## 2022-07-05 ENCOUNTER — Other Ambulatory Visit (HOSPITAL_BASED_OUTPATIENT_CLINIC_OR_DEPARTMENT_OTHER): Payer: Self-pay

## 2022-07-05 ENCOUNTER — Other Ambulatory Visit: Payer: Self-pay

## 2022-07-05 ENCOUNTER — Other Ambulatory Visit: Payer: Self-pay | Admitting: Family Medicine

## 2022-07-05 DIAGNOSIS — I10 Essential (primary) hypertension: Secondary | ICD-10-CM

## 2022-07-05 MED ORDER — AMLODIPINE BESYLATE 10 MG PO TABS
10.0000 mg | ORAL_TABLET | Freq: Every day | ORAL | 3 refills | Status: DC
  Filled 2022-07-05: qty 30, 30d supply, fill #0
  Filled 2022-07-22: qty 30, 30d supply, fill #1
  Filled 2022-08-31: qty 30, 30d supply, fill #2
  Filled 2022-09-27: qty 30, 30d supply, fill #3

## 2022-07-22 ENCOUNTER — Other Ambulatory Visit (HOSPITAL_BASED_OUTPATIENT_CLINIC_OR_DEPARTMENT_OTHER): Payer: Self-pay

## 2022-09-06 ENCOUNTER — Ambulatory Visit (HOSPITAL_BASED_OUTPATIENT_CLINIC_OR_DEPARTMENT_OTHER)
Admission: RE | Admit: 2022-09-06 | Discharge: 2022-09-06 | Disposition: A | Discharge status: Home / Self Care | Payer: BC Managed Care – PPO | Source: Ambulatory Visit | Attending: Acute Care | Admitting: Acute Care

## 2022-09-06 DIAGNOSIS — F17210 Nicotine dependence, cigarettes, uncomplicated: Secondary | ICD-10-CM | POA: Diagnosis not present

## 2022-09-06 DIAGNOSIS — Z122 Encounter for screening for malignant neoplasm of respiratory organs: Secondary | ICD-10-CM | POA: Diagnosis not present

## 2022-09-06 DIAGNOSIS — J438 Other emphysema: Secondary | ICD-10-CM | POA: Diagnosis not present

## 2022-09-06 DIAGNOSIS — Z87891 Personal history of nicotine dependence: Secondary | ICD-10-CM | POA: Diagnosis not present

## 2022-09-13 ENCOUNTER — Other Ambulatory Visit: Payer: Self-pay

## 2022-09-13 DIAGNOSIS — Z87891 Personal history of nicotine dependence: Secondary | ICD-10-CM

## 2022-09-13 DIAGNOSIS — Z122 Encounter for screening for malignant neoplasm of respiratory organs: Secondary | ICD-10-CM

## 2022-09-13 DIAGNOSIS — F17210 Nicotine dependence, cigarettes, uncomplicated: Secondary | ICD-10-CM

## 2022-09-15 ENCOUNTER — Telehealth: Payer: Self-pay | Admitting: Family Medicine

## 2022-09-15 NOTE — Telephone Encounter (Signed)
No. But if he wants to be seen sooner, we can see him 1145 Friday. Ty.

## 2022-09-15 NOTE — Telephone Encounter (Signed)
Patient informed. 

## 2022-09-15 NOTE — Telephone Encounter (Signed)
I scheduled an appointment for the patient on September 29, 2022, to follow up with Dr. Carmelia Roller regarding his lung screening results. He is wondering if he needs to be seen before then. Please advise the patient.

## 2022-09-24 ENCOUNTER — Other Ambulatory Visit (HOSPITAL_BASED_OUTPATIENT_CLINIC_OR_DEPARTMENT_OTHER): Payer: Self-pay

## 2022-09-24 ENCOUNTER — Other Ambulatory Visit: Payer: Self-pay | Admitting: Family Medicine

## 2022-09-24 DIAGNOSIS — I700 Atherosclerosis of aorta: Secondary | ICD-10-CM

## 2022-09-24 MED ORDER — PRAVASTATIN SODIUM 10 MG PO TABS
10.0000 mg | ORAL_TABLET | Freq: Every day | ORAL | 5 refills | Status: DC
  Filled 2022-09-24 – 2022-09-27 (×2): qty 30, 30d supply, fill #0

## 2022-09-27 ENCOUNTER — Other Ambulatory Visit (HOSPITAL_BASED_OUTPATIENT_CLINIC_OR_DEPARTMENT_OTHER): Payer: Self-pay

## 2022-09-29 ENCOUNTER — Other Ambulatory Visit (HOSPITAL_BASED_OUTPATIENT_CLINIC_OR_DEPARTMENT_OTHER): Payer: Self-pay

## 2022-09-29 ENCOUNTER — Encounter: Payer: Self-pay | Admitting: Family Medicine

## 2022-09-29 ENCOUNTER — Ambulatory Visit (INDEPENDENT_AMBULATORY_CARE_PROVIDER_SITE_OTHER): Payer: BC Managed Care – PPO | Admitting: Family Medicine

## 2022-09-29 VITALS — BP 122/80 | HR 57 | Temp 98.2°F | Ht 67.0 in | Wt 163.5 lb

## 2022-09-29 DIAGNOSIS — J439 Emphysema, unspecified: Secondary | ICD-10-CM

## 2022-09-29 DIAGNOSIS — I2583 Coronary atherosclerosis due to lipid rich plaque: Secondary | ICD-10-CM

## 2022-09-29 DIAGNOSIS — I700 Atherosclerosis of aorta: Secondary | ICD-10-CM | POA: Diagnosis not present

## 2022-09-29 DIAGNOSIS — I2510 Atherosclerotic heart disease of native coronary artery without angina pectoris: Secondary | ICD-10-CM

## 2022-09-29 MED ORDER — ATORVASTATIN CALCIUM 40 MG PO TABS
40.0000 mg | ORAL_TABLET | Freq: Every day | ORAL | 3 refills | Status: DC
  Filled 2022-09-29 – 2022-11-29 (×2): qty 90, 90d supply, fill #0
  Filled 2023-02-23: qty 90, 90d supply, fill #1
  Filled 2023-05-24: qty 90, 90d supply, fill #2
  Filled 2023-08-22: qty 90, 90d supply, fill #3

## 2022-09-29 MED ORDER — ATORVASTATIN CALCIUM 20 MG PO TABS
20.0000 mg | ORAL_TABLET | Freq: Every day | ORAL | 0 refills | Status: DC
  Filled 2022-09-29 – 2022-10-29 (×2): qty 30, 30d supply, fill #0

## 2022-09-29 MED ORDER — ATORVASTATIN CALCIUM 10 MG PO TABS
10.0000 mg | ORAL_TABLET | Freq: Every day | ORAL | 0 refills | Status: DC
  Filled 2022-09-29: qty 30, 30d supply, fill #0

## 2022-09-29 NOTE — Progress Notes (Signed)
Chief Complaint  Patient presents with   Discuss lung screening test    Subjective: Patient is a 67 y.o. male here for imaging f/u.  Patient recently had a lung cancer screening test done that showed coronary artery disease and aortic atherosclerosis, pulmonary emphysema.  He does not have any exertional chest pain or shortness of breath that is routine.  He does continue to smoke and he knows he needs to stop.  He would like to try cold [place] first.  He started taking aspirin daily.  He is currently taking pravastatin 10 mg daily.  He did not tolerate rosuvastatin due to myalgias.  Past Medical History:  Diagnosis Date   Hyperlipidemia    Prediabetes     Objective: BP 122/80 (BP Location: Left Arm, Patient Position: Sitting, Cuff Size: Normal)   Pulse (!) 57   Temp 98.2 F (36.8 C) (Oral)   Ht 5\' 7"  (1.702 m)   Wt 163 lb 8 oz (74.2 kg)   SpO2 93%   BMI 25.61 kg/m  General: Awake, appears stated age Lungs: No accessory muscle use Psych: Age appropriate judgment and insight, normal affect and mood  Assessment and Plan: Coronary artery disease due to lipid rich plaque - Plan: atorvastatin (LIPITOR) 10 MG tablet, atorvastatin (LIPITOR) 20 MG tablet, atorvastatin (LIPITOR) 40 MG tablet  Aortic atherosclerosis (HCC)  Pulmonary emphysema, unspecified emphysema type (HCC)  Chronic, not ideally controlled.  Technically primary prevention.  Probably okay taking aspirin at his age and functional status.  Will change pravastatin to atorvastatin.  Start at 10 mg for a month and then steadily uptitrate until 40 mg daily.  Counseled on diet and exercise.  Encouraged smoking cessation.  As he is not having any symptoms of shortness of breath or chest pain, we will hold off on cardiology referral.  We will have him follow-up in 6 weeks for labs and proceed accordingly. The patient voiced understanding and agreement to the plan.  Jilda Roche [place], DO 09/29/22  4:46 PM

## 2022-09-29 NOTE — Patient Instructions (Addendum)
Take a baby aspirin daily.   Let me know if you start having chest pain or shortness of breath.  Stop the pravastatin as we are starting a different statin.   Please stop smoking.   Let us know if you need anything.

## 2022-10-22 ENCOUNTER — Other Ambulatory Visit: Payer: Self-pay | Admitting: Family Medicine

## 2022-10-22 ENCOUNTER — Other Ambulatory Visit (HOSPITAL_BASED_OUTPATIENT_CLINIC_OR_DEPARTMENT_OTHER): Payer: Self-pay

## 2022-10-22 DIAGNOSIS — I2510 Atherosclerotic heart disease of native coronary artery without angina pectoris: Secondary | ICD-10-CM

## 2022-10-22 MED ORDER — ATORVASTATIN CALCIUM 10 MG PO TABS
10.0000 mg | ORAL_TABLET | Freq: Every day | ORAL | 0 refills | Status: DC
  Filled 2022-10-22: qty 30, 30d supply, fill #0

## 2022-10-25 ENCOUNTER — Other Ambulatory Visit: Payer: Self-pay | Admitting: Family Medicine

## 2022-10-25 ENCOUNTER — Other Ambulatory Visit (HOSPITAL_BASED_OUTPATIENT_CLINIC_OR_DEPARTMENT_OTHER): Payer: Self-pay

## 2022-10-25 DIAGNOSIS — I10 Essential (primary) hypertension: Secondary | ICD-10-CM

## 2022-10-25 MED ORDER — AMLODIPINE BESYLATE 10 MG PO TABS
10.0000 mg | ORAL_TABLET | Freq: Every day | ORAL | 3 refills | Status: DC
  Filled 2022-10-25: qty 30, 30d supply, fill #0
  Filled 2022-11-23: qty 30, 30d supply, fill #1
  Filled 2022-12-21 – 2022-12-22 (×2): qty 30, 30d supply, fill #2
  Filled 2023-01-18 – 2023-02-28 (×2): qty 30, 30d supply, fill #3

## 2022-10-29 ENCOUNTER — Other Ambulatory Visit (HOSPITAL_BASED_OUTPATIENT_CLINIC_OR_DEPARTMENT_OTHER): Payer: Self-pay

## 2022-11-21 ENCOUNTER — Other Ambulatory Visit: Payer: Self-pay | Admitting: Family Medicine

## 2022-11-21 DIAGNOSIS — J439 Emphysema, unspecified: Secondary | ICD-10-CM

## 2022-11-22 ENCOUNTER — Other Ambulatory Visit: Payer: Self-pay

## 2022-11-22 ENCOUNTER — Other Ambulatory Visit: Payer: Self-pay | Admitting: Family Medicine

## 2022-11-22 ENCOUNTER — Other Ambulatory Visit (HOSPITAL_BASED_OUTPATIENT_CLINIC_OR_DEPARTMENT_OTHER): Payer: Self-pay

## 2022-11-22 DIAGNOSIS — I2510 Atherosclerotic heart disease of native coronary artery without angina pectoris: Secondary | ICD-10-CM

## 2022-11-22 MED ORDER — ALBUTEROL SULFATE HFA 108 (90 BASE) MCG/ACT IN AERS
2.0000 | INHALATION_SPRAY | Freq: Four times a day (QID) | RESPIRATORY_TRACT | 2 refills | Status: DC | PRN
  Filled 2022-11-22: qty 6.7, 25d supply, fill #0
  Filled 2023-04-26: qty 6.7, 25d supply, fill #1

## 2022-11-22 MED ORDER — ATORVASTATIN CALCIUM 10 MG PO TABS
10.0000 mg | ORAL_TABLET | Freq: Every day | ORAL | 0 refills | Status: DC
  Filled 2022-11-22: qty 30, 30d supply, fill #0

## 2022-11-29 ENCOUNTER — Other Ambulatory Visit: Payer: Self-pay

## 2022-11-29 ENCOUNTER — Other Ambulatory Visit (HOSPITAL_BASED_OUTPATIENT_CLINIC_OR_DEPARTMENT_OTHER): Payer: Self-pay

## 2022-11-29 ENCOUNTER — Other Ambulatory Visit: Payer: Self-pay | Admitting: Family Medicine

## 2022-11-29 DIAGNOSIS — I2510 Atherosclerotic heart disease of native coronary artery without angina pectoris: Secondary | ICD-10-CM

## 2022-11-29 MED ORDER — ATORVASTATIN CALCIUM 20 MG PO TABS
20.0000 mg | ORAL_TABLET | Freq: Every day | ORAL | 0 refills | Status: DC
  Filled 2022-11-29: qty 30, 30d supply, fill #0

## 2022-12-21 ENCOUNTER — Other Ambulatory Visit (HOSPITAL_BASED_OUTPATIENT_CLINIC_OR_DEPARTMENT_OTHER): Payer: Self-pay

## 2022-12-21 ENCOUNTER — Other Ambulatory Visit: Payer: Self-pay | Admitting: Family Medicine

## 2022-12-21 DIAGNOSIS — I2510 Atherosclerotic heart disease of native coronary artery without angina pectoris: Secondary | ICD-10-CM

## 2022-12-21 MED ORDER — ATORVASTATIN CALCIUM 10 MG PO TABS
10.0000 mg | ORAL_TABLET | Freq: Every day | ORAL | 0 refills | Status: DC
  Filled 2022-12-21: qty 30, 30d supply, fill #0

## 2022-12-28 ENCOUNTER — Other Ambulatory Visit: Payer: Self-pay | Admitting: Family Medicine

## 2022-12-28 ENCOUNTER — Other Ambulatory Visit (HOSPITAL_BASED_OUTPATIENT_CLINIC_OR_DEPARTMENT_OTHER): Payer: Self-pay

## 2022-12-28 DIAGNOSIS — I2510 Atherosclerotic heart disease of native coronary artery without angina pectoris: Secondary | ICD-10-CM

## 2022-12-28 MED ORDER — ATORVASTATIN CALCIUM 20 MG PO TABS
20.0000 mg | ORAL_TABLET | Freq: Every day | ORAL | 0 refills | Status: DC
  Filled 2022-12-28: qty 30, 30d supply, fill #0

## 2023-01-08 ENCOUNTER — Other Ambulatory Visit (HOSPITAL_BASED_OUTPATIENT_CLINIC_OR_DEPARTMENT_OTHER): Payer: Self-pay

## 2023-01-11 ENCOUNTER — Other Ambulatory Visit (HOSPITAL_BASED_OUTPATIENT_CLINIC_OR_DEPARTMENT_OTHER): Payer: Self-pay

## 2023-01-11 MED ORDER — OMEPRAZOLE 20 MG PO CPDR
20.0000 mg | DELAYED_RELEASE_CAPSULE | Freq: Every day | ORAL | 3 refills | Status: DC
Start: 2023-01-11 — End: 2023-05-31
  Filled 2023-01-11: qty 30, 30d supply, fill #0
  Filled 2023-02-28: qty 30, 30d supply, fill #1
  Filled 2023-04-17: qty 30, 30d supply, fill #2
  Filled 2023-05-15: qty 30, 30d supply, fill #3

## 2023-01-14 ENCOUNTER — Other Ambulatory Visit (HOSPITAL_BASED_OUTPATIENT_CLINIC_OR_DEPARTMENT_OTHER): Payer: Self-pay

## 2023-01-14 ENCOUNTER — Other Ambulatory Visit: Payer: Self-pay | Admitting: Family Medicine

## 2023-01-14 DIAGNOSIS — I2510 Atherosclerotic heart disease of native coronary artery without angina pectoris: Secondary | ICD-10-CM

## 2023-01-14 MED ORDER — ATORVASTATIN CALCIUM 10 MG PO TABS
10.0000 mg | ORAL_TABLET | Freq: Every day | ORAL | 0 refills | Status: DC
  Filled 2023-01-14 – 2023-01-20 (×2): qty 30, 30d supply, fill #0

## 2023-01-18 ENCOUNTER — Other Ambulatory Visit: Payer: Self-pay

## 2023-01-20 ENCOUNTER — Other Ambulatory Visit (HOSPITAL_BASED_OUTPATIENT_CLINIC_OR_DEPARTMENT_OTHER): Payer: Self-pay

## 2023-01-27 ENCOUNTER — Other Ambulatory Visit: Payer: Self-pay | Admitting: Family Medicine

## 2023-01-27 ENCOUNTER — Other Ambulatory Visit (HOSPITAL_BASED_OUTPATIENT_CLINIC_OR_DEPARTMENT_OTHER): Payer: Self-pay

## 2023-01-27 DIAGNOSIS — I2510 Atherosclerotic heart disease of native coronary artery without angina pectoris: Secondary | ICD-10-CM

## 2023-01-27 MED ORDER — ATORVASTATIN CALCIUM 20 MG PO TABS
20.0000 mg | ORAL_TABLET | Freq: Every day | ORAL | 0 refills | Status: DC
  Filled 2023-01-27: qty 30, 30d supply, fill #0

## 2023-02-07 ENCOUNTER — Other Ambulatory Visit (HOSPITAL_BASED_OUTPATIENT_CLINIC_OR_DEPARTMENT_OTHER): Payer: Self-pay

## 2023-02-21 ENCOUNTER — Other Ambulatory Visit: Payer: Self-pay | Admitting: Family Medicine

## 2023-02-21 ENCOUNTER — Other Ambulatory Visit (HOSPITAL_BASED_OUTPATIENT_CLINIC_OR_DEPARTMENT_OTHER): Payer: Self-pay

## 2023-02-21 DIAGNOSIS — I2510 Atherosclerotic heart disease of native coronary artery without angina pectoris: Secondary | ICD-10-CM

## 2023-02-21 MED ORDER — ATORVASTATIN CALCIUM 10 MG PO TABS
10.0000 mg | ORAL_TABLET | Freq: Every day | ORAL | 0 refills | Status: DC
  Filled 2023-02-21 – 2023-02-22 (×2): qty 30, 30d supply, fill #0

## 2023-02-22 ENCOUNTER — Other Ambulatory Visit (HOSPITAL_BASED_OUTPATIENT_CLINIC_OR_DEPARTMENT_OTHER): Payer: Self-pay

## 2023-02-22 ENCOUNTER — Other Ambulatory Visit: Payer: Self-pay

## 2023-02-23 ENCOUNTER — Other Ambulatory Visit: Payer: Self-pay | Admitting: Family Medicine

## 2023-02-23 ENCOUNTER — Other Ambulatory Visit (HOSPITAL_BASED_OUTPATIENT_CLINIC_OR_DEPARTMENT_OTHER): Payer: Self-pay

## 2023-02-23 ENCOUNTER — Other Ambulatory Visit: Payer: Self-pay

## 2023-02-23 DIAGNOSIS — I2510 Atherosclerotic heart disease of native coronary artery without angina pectoris: Secondary | ICD-10-CM

## 2023-02-23 MED ORDER — ATORVASTATIN CALCIUM 20 MG PO TABS
20.0000 mg | ORAL_TABLET | Freq: Every day | ORAL | 0 refills | Status: DC
  Filled 2023-02-23: qty 30, 30d supply, fill #0

## 2023-03-22 ENCOUNTER — Other Ambulatory Visit (HOSPITAL_BASED_OUTPATIENT_CLINIC_OR_DEPARTMENT_OTHER): Payer: Self-pay

## 2023-03-22 ENCOUNTER — Other Ambulatory Visit: Payer: Self-pay | Admitting: Family Medicine

## 2023-03-22 DIAGNOSIS — I2510 Atherosclerotic heart disease of native coronary artery without angina pectoris: Secondary | ICD-10-CM

## 2023-03-22 MED ORDER — ATORVASTATIN CALCIUM 10 MG PO TABS
10.0000 mg | ORAL_TABLET | Freq: Every day | ORAL | 0 refills | Status: DC
  Filled 2023-03-22 – 2023-04-05 (×2): qty 30, 30d supply, fill #0

## 2023-03-23 ENCOUNTER — Other Ambulatory Visit: Payer: Self-pay | Admitting: Family Medicine

## 2023-03-23 ENCOUNTER — Other Ambulatory Visit (HOSPITAL_BASED_OUTPATIENT_CLINIC_OR_DEPARTMENT_OTHER): Payer: Self-pay

## 2023-03-23 DIAGNOSIS — I2510 Atherosclerotic heart disease of native coronary artery without angina pectoris: Secondary | ICD-10-CM

## 2023-03-23 MED ORDER — ATORVASTATIN CALCIUM 20 MG PO TABS
20.0000 mg | ORAL_TABLET | Freq: Every day | ORAL | 0 refills | Status: DC
  Filled 2023-03-23 – 2023-04-05 (×2): qty 30, 30d supply, fill #0

## 2023-03-24 ENCOUNTER — Other Ambulatory Visit: Payer: Self-pay | Admitting: Family Medicine

## 2023-03-24 ENCOUNTER — Other Ambulatory Visit (HOSPITAL_BASED_OUTPATIENT_CLINIC_OR_DEPARTMENT_OTHER): Payer: Self-pay

## 2023-03-24 DIAGNOSIS — I10 Essential (primary) hypertension: Secondary | ICD-10-CM

## 2023-03-24 MED ORDER — AMLODIPINE BESYLATE 10 MG PO TABS
10.0000 mg | ORAL_TABLET | Freq: Every day | ORAL | 3 refills | Status: DC
  Filled 2023-04-07: qty 30, 30d supply, fill #0
  Filled 2023-05-02 – 2023-05-15 (×2): qty 30, 30d supply, fill #1
  Filled 2023-06-13: qty 30, 30d supply, fill #2
  Filled 2023-07-10: qty 30, 30d supply, fill #3

## 2023-04-04 ENCOUNTER — Other Ambulatory Visit (HOSPITAL_BASED_OUTPATIENT_CLINIC_OR_DEPARTMENT_OTHER): Payer: Self-pay

## 2023-04-05 ENCOUNTER — Other Ambulatory Visit (HOSPITAL_BASED_OUTPATIENT_CLINIC_OR_DEPARTMENT_OTHER): Payer: Self-pay

## 2023-04-07 ENCOUNTER — Other Ambulatory Visit (HOSPITAL_BASED_OUTPATIENT_CLINIC_OR_DEPARTMENT_OTHER): Payer: Self-pay

## 2023-05-13 ENCOUNTER — Other Ambulatory Visit (HOSPITAL_BASED_OUTPATIENT_CLINIC_OR_DEPARTMENT_OTHER): Payer: Self-pay

## 2023-05-31 ENCOUNTER — Other Ambulatory Visit: Payer: Self-pay | Admitting: Family Medicine

## 2023-05-31 ENCOUNTER — Other Ambulatory Visit (HOSPITAL_BASED_OUTPATIENT_CLINIC_OR_DEPARTMENT_OTHER): Payer: Self-pay

## 2023-05-31 MED ORDER — OMEPRAZOLE 20 MG PO CPDR
20.0000 mg | DELAYED_RELEASE_CAPSULE | Freq: Every day | ORAL | 3 refills | Status: DC
  Filled 2023-05-31 – 2023-06-13 (×2): qty 30, 30d supply, fill #0

## 2023-06-14 ENCOUNTER — Other Ambulatory Visit (HOSPITAL_BASED_OUTPATIENT_CLINIC_OR_DEPARTMENT_OTHER): Payer: Self-pay

## 2023-08-08 ENCOUNTER — Other Ambulatory Visit (HOSPITAL_BASED_OUTPATIENT_CLINIC_OR_DEPARTMENT_OTHER): Payer: Self-pay

## 2023-08-08 ENCOUNTER — Other Ambulatory Visit: Payer: Self-pay | Admitting: Family Medicine

## 2023-08-08 DIAGNOSIS — I10 Essential (primary) hypertension: Secondary | ICD-10-CM

## 2023-08-08 MED ORDER — AMLODIPINE BESYLATE 10 MG PO TABS
10.0000 mg | ORAL_TABLET | Freq: Every day | ORAL | 3 refills | Status: DC
  Filled 2023-08-08: qty 30, 30d supply, fill #0
  Filled 2023-08-26 – 2023-09-07 (×3): qty 30, 30d supply, fill #1
  Filled 2023-10-07: qty 30, 30d supply, fill #2
  Filled 2023-12-05: qty 30, 30d supply, fill #3

## 2023-08-18 ENCOUNTER — Encounter: Payer: Self-pay | Admitting: Acute Care

## 2023-08-26 ENCOUNTER — Other Ambulatory Visit (HOSPITAL_BASED_OUTPATIENT_CLINIC_OR_DEPARTMENT_OTHER): Payer: Self-pay

## 2023-08-26 DIAGNOSIS — H2513 Age-related nuclear cataract, bilateral: Secondary | ICD-10-CM | POA: Diagnosis not present

## 2023-08-26 DIAGNOSIS — H04123 Dry eye syndrome of bilateral lacrimal glands: Secondary | ICD-10-CM | POA: Diagnosis not present

## 2023-09-16 DIAGNOSIS — H25013 Cortical age-related cataract, bilateral: Secondary | ICD-10-CM | POA: Diagnosis not present

## 2023-09-23 ENCOUNTER — Ambulatory Visit (INDEPENDENT_AMBULATORY_CARE_PROVIDER_SITE_OTHER): Admitting: Family Medicine

## 2023-09-23 ENCOUNTER — Ambulatory Visit: Payer: Self-pay | Admitting: Family Medicine

## 2023-09-23 ENCOUNTER — Encounter: Payer: Self-pay | Admitting: Family Medicine

## 2023-09-23 VITALS — BP 110/62 | HR 72 | Temp 98.0°F | Resp 18 | Ht 67.0 in | Wt 162.0 lb

## 2023-09-23 DIAGNOSIS — Z0000 Encounter for general adult medical examination without abnormal findings: Secondary | ICD-10-CM

## 2023-09-23 DIAGNOSIS — I700 Atherosclerosis of aorta: Secondary | ICD-10-CM

## 2023-09-23 DIAGNOSIS — R7303 Prediabetes: Secondary | ICD-10-CM

## 2023-09-23 DIAGNOSIS — Z125 Encounter for screening for malignant neoplasm of prostate: Secondary | ICD-10-CM

## 2023-09-23 DIAGNOSIS — Z136 Encounter for screening for cardiovascular disorders: Secondary | ICD-10-CM

## 2023-09-23 LAB — COMPREHENSIVE METABOLIC PANEL WITH GFR
ALT: 31 U/L (ref 0–53)
AST: 25 U/L (ref 0–37)
Albumin: 4.5 g/dL (ref 3.5–5.2)
Alkaline Phosphatase: 44 U/L (ref 39–117)
BUN: 13 mg/dL (ref 6–23)
CO2: 29 meq/L (ref 19–32)
Calcium: 9.1 mg/dL (ref 8.4–10.5)
Chloride: 104 meq/L (ref 96–112)
Creatinine, Ser: 0.75 mg/dL (ref 0.40–1.50)
GFR: 94.03 mL/min (ref >60.00)
Glucose, Bld: 104 mg/dL — ABNORMAL HIGH (ref 70–99)
Potassium: 4.1 meq/L (ref 3.5–5.1)
Sodium: 141 meq/L (ref 135–145)
Total Bilirubin: 0.8 mg/dL (ref 0.2–1.2)
Total Protein: 7 g/dL (ref 6.0–8.3)

## 2023-09-23 LAB — LIPID PANEL
Cholesterol: 118 mg/dL (ref 0–200)
HDL: 46 mg/dL (ref >39.00)
LDL Cholesterol: 57 mg/dL (ref 0–99)
NonHDL: 71.92
Total CHOL/HDL Ratio: 3
Triglycerides: 73 mg/dL (ref 0.0–149.0)
VLDL: 14.6 mg/dL (ref 0.0–40.0)

## 2023-09-23 LAB — CBC
HCT: 46.7 % (ref 39.0–52.0)
Hemoglobin: 15.3 g/dL (ref 13.0–17.0)
MCHC: 32.7 g/dL (ref 30.0–36.0)
MCV: 94.8 fl (ref 78.0–100.0)
Platelets: 352 K/uL (ref 150.0–400.0)
RBC: 4.92 Mil/uL (ref 4.22–5.81)
RDW: 13.7 % (ref 11.5–15.5)
WBC: 6.2 K/uL (ref 4.0–10.5)

## 2023-09-23 LAB — HEMOGLOBIN A1C: Hgb A1c MFr Bld: 6.8 % — ABNORMAL HIGH (ref 4.6–6.5)

## 2023-09-23 LAB — PSA: PSA: 1.34 ng/mL (ref 0.10–4.00)

## 2023-09-23 NOTE — Patient Instructions (Addendum)
 Give us  2-3 business days to get the results of your labs back.   Keep the diet clean and stay active.  Please get me a copy of your advanced directive form at your convenience.   Please get your pneumonia vaccine at our clinic or at the pharmacy.   The Shingrix vaccine (for shingles) is a 2 shot series spaced 2-6 months apart. It can make people feel low energy, achy and almost like they have the flu for 48 hours after injection. 1/5 people can have nausea and/or vomiting. Please plan accordingly when deciding on when to get this shot. Call our office for a nurse visit appointment to get this. The second shot of the series is less severe regarding the side effects, but it still lasts 48 hours.   I recommend getting the flu shot in mid October. This suggestion would change if the CDC comes out with a different recommendation.   Let us  know if you need anything.

## 2023-09-23 NOTE — Progress Notes (Signed)
 Chief Complaint  Patient presents with   Annual Exam    Well Male Justin Edwards is here for a complete physical.   His last physical was >1 year ago.  Current diet: in general, a healthy diet.   Current exercise: walking, active in yard Weight trend: stable Fatigue out of ordinary? No. Seat belt? Yes.   Advanced directive? No  Health maintenance Shingrix- No Colonoscopy- Yes Tetanus- Yes Hep C- Yes Lung cancer screening- Due Pneumonia vaccine- No AAA screening- No  Past Medical History:  Diagnosis Date   Hyperlipidemia    Prediabetes      History reviewed. No pertinent surgical history.  Medications  Current Outpatient Medications on File Prior to Visit  Medication Sig Dispense Refill   albuterol  (VENTOLIN  HFA) 108 (90 Base) MCG/ACT inhaler Inhale 2 puffs by mouth into the lungs every 6 (six) hours as needed for wheezing or shortness of breath. 6.7 g 2   amLODipine  (NORVASC ) 10 MG tablet Take 1 tablet (10 mg total) by mouth daily. 30 tablet 3   atorvastatin  (LIPITOR) 40 MG tablet Take 1 tablet (40 mg total) by mouth daily. start 11/28/22 90 tablet 3   No current facility-administered medications on file prior to visit.     Allergies No Known Allergies  Family History Family History  Problem Relation Age of Onset   Anxiety disorder Father     Review of Systems: Constitutional:  no fevers Eye:  no recent significant change in vision Ears:  No changes in hearing Nose/Mouth/Throat:  no complaints of nasal congestion, no sore throat Cardiovascular: no chest pain Respiratory:  No shortness of breath Gastrointestinal:  No change in bowel habits GU:  No frequency Integumentary:  no abnormal skin lesions reported Neurologic:  no headaches Endocrine:  denies unexplained weight changes  Exam BP 110/62   Pulse 72   Temp 98 F (36.7 C)   Resp 18   Ht 5' 7 (1.702 m)   Wt 162 lb (73.5 kg)   SpO2 98%   BMI 25.37 kg/m  General:  well developed, well  nourished, in no apparent distress Skin:  no significant moles, warts, or growths Head:  no masses, lesions, or tenderness Eyes:  pupils equal and round, sclera anicteric without injection Ears:  canals without lesions, TMs shiny without retraction, no obvious effusion, no erythema Nose:  nares patent, mucosa normal Throat/Pharynx:  lips and gingiva without lesion; tongue and uvula midline; non-inflamed pharynx; no exudates or postnasal drainage Lungs:  clear to auscultation, breath sounds equal bilaterally, no respiratory distress Cardio:  regular rate and rhythm, no LE edema or bruits Rectal: Deferred GI: BS+, S, NT, ND, no masses or organomegaly Musculoskeletal:  symmetrical muscle groups noted without atrophy or deformity Neuro:  gait normal; deep tendon reflexes normal and symmetric Psych: well oriented with normal range of affect and appropriate judgment/insight  Assessment and Plan  Well adult exam - Plan: CBC, Comprehensive metabolic panel with GFR, Lipid panel  Aortic atherosclerosis (HCC)  Prediabetes - Plan: Hemoglobin A1c  Screening for prostate cancer - Plan: PSA  Screening for AAA (abdominal aortic aneurysm) - Plan: [place]  AORTA MEDICARE SCREENING   Well 68 y.o. male. Counseled on diet and exercise. Advanced directive form provided today.  Shingrix rec'd. He will think about it.  PCV20 rec'd, he will consider getting this as well.  AAA screening as above. He will be reaching out to pulm team.  Other orders as above. Follow up in 6 mo.  The patient  voiced understanding and agreement to the plan.  Justin Mt [place], DO 09/23/23 8:32 AM

## 2023-09-27 ENCOUNTER — Other Ambulatory Visit: Payer: Self-pay | Admitting: Family Medicine

## 2023-09-27 ENCOUNTER — Other Ambulatory Visit: Payer: Self-pay

## 2023-09-27 ENCOUNTER — Other Ambulatory Visit (HOSPITAL_BASED_OUTPATIENT_CLINIC_OR_DEPARTMENT_OTHER): Payer: Self-pay

## 2023-09-27 DIAGNOSIS — R7303 Prediabetes: Secondary | ICD-10-CM

## 2023-09-27 MED ORDER — METFORMIN HCL ER 500 MG PO TB24
500.0000 mg | ORAL_TABLET | Freq: Every day | ORAL | 2 refills | Status: DC
  Filled 2023-09-27: qty 30, 30d supply, fill #0
  Filled 2023-10-21: qty 30, 30d supply, fill #1

## 2023-10-05 DIAGNOSIS — H2511 Age-related nuclear cataract, right eye: Secondary | ICD-10-CM | POA: Diagnosis not present

## 2023-10-05 DIAGNOSIS — H25811 Combined forms of age-related cataract, right eye: Secondary | ICD-10-CM | POA: Diagnosis not present

## 2023-10-05 DIAGNOSIS — H25041 Posterior subcapsular polar age-related cataract, right eye: Secondary | ICD-10-CM | POA: Diagnosis not present

## 2023-10-05 DIAGNOSIS — H25011 Cortical age-related cataract, right eye: Secondary | ICD-10-CM | POA: Diagnosis not present

## 2023-10-26 ENCOUNTER — Other Ambulatory Visit (HOSPITAL_BASED_OUTPATIENT_CLINIC_OR_DEPARTMENT_OTHER): Payer: Self-pay

## 2023-11-21 ENCOUNTER — Other Ambulatory Visit: Payer: Self-pay | Admitting: Family Medicine

## 2023-11-21 ENCOUNTER — Other Ambulatory Visit (HOSPITAL_BASED_OUTPATIENT_CLINIC_OR_DEPARTMENT_OTHER): Payer: Self-pay

## 2023-11-21 DIAGNOSIS — I2510 Atherosclerotic heart disease of native coronary artery without angina pectoris: Secondary | ICD-10-CM

## 2023-11-21 MED ORDER — ATORVASTATIN CALCIUM 40 MG PO TABS
40.0000 mg | ORAL_TABLET | Freq: Every day | ORAL | 3 refills | Status: AC
  Filled 2023-11-21: qty 90, 90d supply, fill #0
  Filled 2024-02-20: qty 90, 90d supply, fill #1

## 2023-11-28 ENCOUNTER — Other Ambulatory Visit (HOSPITAL_BASED_OUTPATIENT_CLINIC_OR_DEPARTMENT_OTHER): Payer: Self-pay

## 2023-11-28 MED ORDER — PREDNISOLONE ACETATE 1 % OP SUSP
1.0000 [drp] | Freq: Every day | OPHTHALMIC | 3 refills | Status: AC
  Filled 2023-11-28: qty 10, 90d supply, fill #0
  Filled 2024-02-21: qty 10, 90d supply, fill #1

## 2023-12-29 ENCOUNTER — Other Ambulatory Visit: Payer: Self-pay | Admitting: Family Medicine

## 2023-12-29 ENCOUNTER — Other Ambulatory Visit (HOSPITAL_BASED_OUTPATIENT_CLINIC_OR_DEPARTMENT_OTHER): Payer: Self-pay

## 2023-12-29 DIAGNOSIS — I10 Essential (primary) hypertension: Secondary | ICD-10-CM

## 2023-12-29 MED ORDER — AMLODIPINE BESYLATE 10 MG PO TABS
10.0000 mg | ORAL_TABLET | Freq: Every day | ORAL | 3 refills | Status: AC
  Filled 2024-01-01: qty 30, 30d supply, fill #0
  Filled 2024-01-30: qty 30, 30d supply, fill #1

## 2023-12-30 ENCOUNTER — Other Ambulatory Visit

## 2024-01-02 ENCOUNTER — Other Ambulatory Visit (HOSPITAL_BASED_OUTPATIENT_CLINIC_OR_DEPARTMENT_OTHER): Payer: Self-pay

## 2024-01-02 ENCOUNTER — Other Ambulatory Visit: Payer: Self-pay

## 2024-01-11 ENCOUNTER — Ambulatory Visit: Payer: Self-pay

## 2024-01-11 NOTE — Telephone Encounter (Signed)
°  FYI Only or Action Required?: FYI only for provider: appointment scheduled on 12.19.25.  Patient was last seen in primary care on 09/23/2023 by Frann Mabel Mt, DO.  Called Nurse Triage reporting Rash.  Symptoms began about a month ago.  Interventions attempted: Nothing.  Symptoms are: unchanged.  Triage Disposition: See Physician Within 24 Hours  Patient/caregiver understands and will follow disposition?: Yes   Message from Alexandria E sent at 01/11/2024  9:43 AM EST  Summary: Rash on lower back and hips.   Reason for Triage: Rash on lower back, and hips. Patient stated it is not inflamed or painful, just itchy. Rash has been there for about a month.     Reason for Disposition  Ring-like appearance of rash (or ask: does it look like a target or bulls- eye)  Answer Assessment - Initial Assessment Questions 1. APPEARANCE of RASH: What does the rash look like? (e.g., blisters, dry flaky skin, red spots, redness, sores)       Redness present        2. LOCATION: Where is the rash located?     Left and right side of back/hip. Both legs  3. COLOR: What color is the rash? (Note: It is difficult to assess rash color in people with darker-colored skin. When this situation occurs, simply ask the caller to describe what they see.)     Redness sometimes  4. ONSET: When did the rash begin? 1 month      5. FEVER: Do you have a fever? If Yes, ask: What is your temperature, how was it measured, and when did it start?      denies  6. ITCHING: Does the rash itch? If Yes, ask: How bad is the itch? (Scale 1-10; or mild, moderate, severe) 8/10      7. CAUSE: What do you think is causing the rash?      Pt unsure  9. MEDICINE FACTORS: Have you started any new medicines within the last 2 weeks? (e.g., antibiotics)      denies  10. OTHER SYMPTOMS:Pt denies dizziness, headache, sore throat, joint pain  Pt reports itching and redness- rash on his back  and legs Pt scheduled for a visit on 12.19.25 for further evaluation. Pt advised against taking hot showers/baths. Pt advised against scratching due to increased risk of infection and exacerbation of itching. Pt advised to try Hydrocortisone cream 1% Pt agrees with plan of care, will call back for any worsening symptoms  Protocols used: Rash or Redness - [facility]

## 2024-01-11 NOTE — Telephone Encounter (Signed)
 Appt scheduled

## 2024-01-13 ENCOUNTER — Ambulatory Visit: Payer: Self-pay | Admitting: Family Medicine

## 2024-01-13 ENCOUNTER — Ambulatory Visit: Admitting: Family Medicine

## 2024-01-13 ENCOUNTER — Encounter: Payer: Self-pay | Admitting: Family Medicine

## 2024-01-13 ENCOUNTER — Other Ambulatory Visit (HOSPITAL_BASED_OUTPATIENT_CLINIC_OR_DEPARTMENT_OTHER): Payer: Self-pay

## 2024-01-13 VITALS — BP 120/68 | HR 85 | Temp 98.3°F | Resp 16 | Ht 67.0 in | Wt 159.2 lb

## 2024-01-13 DIAGNOSIS — L2489 Irritant contact dermatitis due to other agents: Secondary | ICD-10-CM | POA: Diagnosis not present

## 2024-01-13 DIAGNOSIS — R7303 Prediabetes: Secondary | ICD-10-CM | POA: Diagnosis not present

## 2024-01-13 DIAGNOSIS — E1165 Type 2 diabetes mellitus with hyperglycemia: Secondary | ICD-10-CM | POA: Insufficient documentation

## 2024-01-13 LAB — HEMOGLOBIN A1C: Hgb A1c MFr Bld: 6.6 % — ABNORMAL HIGH (ref 4.6–6.5)

## 2024-01-13 MED ORDER — TRIAMCINOLONE ACETONIDE 0.5 % EX CREA
1.0000 | TOPICAL_CREAM | Freq: Two times a day (BID) | CUTANEOUS | 0 refills | Status: AC
  Filled 2024-01-13: qty 15, 30d supply, fill #0
  Filled 2024-01-29 – 2024-02-06 (×2): qty 15, 30d supply, fill #1

## 2024-01-13 NOTE — Progress Notes (Signed)
 Chief Complaint  Patient presents with   Rash    Rash    Justin Edwards is a 68 y.o. male here for a skin complaint.  Duration: 1 mo Location: neck, inner thighs, lower torso Pruritic? Yes Painful? No Drainage? No New soaps/lotions/topicals/detergents? Yes -new soaps Sick contacts? No Other associated symptoms: seems to be spreading Therapies tried thus far: Claritin  Past Medical History:  Diagnosis Date   Hyperlipidemia    Prediabetes     BP 120/68 (BP Location: Left Arm, Patient Position: Sitting)   Pulse 85   Temp 98.3 F (36.8 C) (Oral)   Resp 16   Ht 5' 7 (1.702 m)   Wt 159 lb 3.2 oz (72.2 kg)   SpO2 97%   BMI 24.93 kg/m  Gen: awake, alert, appearing stated age Lungs: No accessory muscle use Skin: pink patches/macules that blanch on lower torso circumferentially. No drainage, erythema, TTP, fluctuance, excoriation Psych: Age appropriate judgment and insight  Irritant contact dermatitis due to other agents - Plan: triamcinolone cream (KENALOG) 0.5 %  Prediabetes - Plan: Hemoglobin A1c  Stop scented soaps/lotions. Cont Claritin. Add Kenalog cream as above. Non-scented emollients rec'd.  F/u on A1c.  Sleep team info provided.  F/u prn. The patient voiced understanding and agreement to the plan.  Mabel Mt [place], DO 01/13/2024 8:15 AM

## 2024-01-13 NOTE — Patient Instructions (Addendum)
 Try not to scratch as this can make things worse. Avoid scented products while dealing with this. You may resume when the itchiness resolves. Cold/cool compresses can help.   Consider at least twice daily use of a non-scented lotion/emollient like Aveeno, Aquaphor, Vaseline, Eucerin, etc. Make sure it does not have a scent.  Please contact your sleep team: [phone]   Continue the Claritin.  Give us  2-3 business days to get the results of your labs back.   Let us  know if you need anything.

## 2024-01-30 ENCOUNTER — Other Ambulatory Visit (HOSPITAL_BASED_OUTPATIENT_CLINIC_OR_DEPARTMENT_OTHER): Payer: Self-pay

## 2024-02-21 ENCOUNTER — Other Ambulatory Visit: Payer: Self-pay | Admitting: Family Medicine

## 2024-02-21 DIAGNOSIS — J439 Emphysema, unspecified: Secondary | ICD-10-CM

## 2024-02-22 ENCOUNTER — Other Ambulatory Visit (HOSPITAL_BASED_OUTPATIENT_CLINIC_OR_DEPARTMENT_OTHER): Payer: Self-pay

## 2024-02-22 MED ORDER — ALBUTEROL SULFATE HFA 108 (90 BASE) MCG/ACT IN AERS
2.0000 | INHALATION_SPRAY | Freq: Four times a day (QID) | RESPIRATORY_TRACT | 5 refills | Status: AC | PRN
  Filled 2024-02-22: qty 6.7, 25d supply, fill #0

## 2024-02-24 ENCOUNTER — Encounter (HOSPITAL_BASED_OUTPATIENT_CLINIC_OR_DEPARTMENT_OTHER): Payer: Self-pay

## 2024-02-24 ENCOUNTER — Ambulatory Visit (HOSPITAL_BASED_OUTPATIENT_CLINIC_OR_DEPARTMENT_OTHER): Attending: Family Medicine

## 2024-04-13 ENCOUNTER — Ambulatory Visit: Admitting: Family Medicine
